# Patient Record
Sex: Male | Born: 1950 | Race: White | Hispanic: No | Marital: Married | Smoking: Never smoker
Health system: Southern US, Community
[De-identification: ages and names within clinical notes are randomized; demographics above are authoritative.]

## PROBLEM LIST (undated history)

## (undated) DIAGNOSIS — N200 Calculus of kidney: Secondary | ICD-10-CM

## (undated) HISTORY — PX: BACK SURGERY: SHX140

---

## 2016-12-20 ENCOUNTER — Emergency Department (HOSPITAL_BASED_OUTPATIENT_CLINIC_OR_DEPARTMENT_OTHER): Payer: Medicare HMO

## 2016-12-20 ENCOUNTER — Other Ambulatory Visit: Payer: Self-pay

## 2016-12-20 ENCOUNTER — Inpatient Hospital Stay (HOSPITAL_BASED_OUTPATIENT_CLINIC_OR_DEPARTMENT_OTHER)
Admission: EM | Admit: 2016-12-20 | Discharge: 2017-01-03 | DRG: 024 | Disposition: A | Discharge status: Home Health | Payer: Medicare HMO | Attending: Neurosurgery | Admitting: Neurosurgery

## 2016-12-20 ENCOUNTER — Encounter (HOSPITAL_BASED_OUTPATIENT_CLINIC_OR_DEPARTMENT_OTHER): Payer: Self-pay

## 2016-12-20 DIAGNOSIS — M533 Sacrococcygeal disorders, not elsewhere classified: Secondary | ICD-10-CM | POA: Diagnosis present

## 2016-12-20 DIAGNOSIS — G914 Hydrocephalus in diseases classified elsewhere: Secondary | ICD-10-CM | POA: Diagnosis present

## 2016-12-20 DIAGNOSIS — R61 Generalized hyperhidrosis: Secondary | ICD-10-CM | POA: Diagnosis present

## 2016-12-20 DIAGNOSIS — M5489 Other dorsalgia: Secondary | ICD-10-CM | POA: Diagnosis present

## 2016-12-20 DIAGNOSIS — R2681 Unsteadiness on feet: Secondary | ICD-10-CM | POA: Diagnosis present

## 2016-12-20 DIAGNOSIS — I609 Nontraumatic subarachnoid hemorrhage, unspecified: Secondary | ICD-10-CM | POA: Diagnosis present

## 2016-12-20 DIAGNOSIS — Z87442 Personal history of urinary calculi: Secondary | ICD-10-CM | POA: Diagnosis not present

## 2016-12-20 DIAGNOSIS — I608 Other nontraumatic subarachnoid hemorrhage: Principal | ICD-10-CM | POA: Diagnosis present

## 2016-12-20 DIAGNOSIS — M542 Cervicalgia: Secondary | ICD-10-CM | POA: Diagnosis present

## 2016-12-20 HISTORY — DX: Calculus of kidney: N20.0

## 2016-12-20 LAB — BASIC METABOLIC PANEL
ANION GAP: 10 (ref 5–15)
BUN: 20 mg/dL (ref 6–20)
CHLORIDE: 102 mmol/L (ref 101–111)
CO2: 25 mmol/L (ref 22–32)
Calcium: 8.9 mg/dL (ref 8.9–10.3)
Creatinine, Ser: 0.99 mg/dL (ref 0.61–1.24)
GFR calc Af Amer: >60 mL/min (ref >60)
GFR calc non Af Amer: >60 mL/min (ref >60)
Glucose, Bld: 110 mg/dL — ABNORMAL HIGH (ref 65–99)
POTASSIUM: 3.4 mmol/L — AB (ref 3.5–5.1)
Sodium: 137 mmol/L (ref 135–145)

## 2016-12-20 LAB — CBC WITH DIFFERENTIAL/PLATELET
BASOS ABS: 0 10*3/uL (ref 0.0–0.1)
BASOS PCT: 1 %
Eosinophils Absolute: 0.5 10*3/uL (ref 0.0–0.7)
Eosinophils Relative: 8 %
HEMATOCRIT: 42.3 % (ref 39.0–52.0)
HEMOGLOBIN: 14.6 g/dL (ref 13.0–17.0)
Lymphocytes Relative: 27 %
Lymphs Abs: 1.5 10*3/uL (ref 0.7–4.0)
MCH: 30.7 pg (ref 26.0–34.0)
MCHC: 34.5 g/dL (ref 30.0–36.0)
MCV: 88.9 fL (ref 78.0–100.0)
Monocytes Absolute: 0.6 10*3/uL (ref 0.1–1.0)
Monocytes Relative: 11 %
NEUTROS PCT: 53 %
Neutro Abs: 3.1 10*3/uL (ref 1.7–7.7)
Platelets: 196 10*3/uL (ref 150–400)
RBC: 4.76 MIL/uL (ref 4.22–5.81)
RDW: 12.8 % (ref 11.5–15.5)
WBC: 5.7 10*3/uL (ref 4.0–10.5)

## 2016-12-20 MED ORDER — PROMETHAZINE HCL 25 MG/ML IJ SOLN
12.5000 mg | Freq: Once | INTRAMUSCULAR | Status: AC
Start: 2016-12-20 — End: 2016-12-20
  Administered 2016-12-20: 12.5 mg via INTRAVENOUS
  Filled 2016-12-20: qty 1

## 2016-12-20 MED ORDER — ONDANSETRON HCL 4 MG/2ML IJ SOLN
INTRAMUSCULAR | Status: AC
  Filled 2016-12-20: qty 2

## 2016-12-20 MED ORDER — NIMODIPINE 60 MG/20ML PO SOLN
60.0000 mg | ORAL | Status: DC
  Filled 2016-12-20: qty 20

## 2016-12-20 MED ORDER — PANTOPRAZOLE SODIUM 40 MG PO TBEC
40.0000 mg | DELAYED_RELEASE_TABLET | Freq: Every day | ORAL | Status: DC
  Administered 2016-12-22 – 2017-01-03 (×13): 40 mg via ORAL
  Filled 2016-12-20 (×13): qty 1

## 2016-12-20 MED ORDER — MORPHINE SULFATE (PF) 4 MG/ML IV SOLN
INTRAVENOUS | Status: AC
  Administered 2016-12-20: 4 mg via INTRAVENOUS
  Filled 2016-12-20: qty 1

## 2016-12-20 MED ORDER — ONDANSETRON 4 MG PO TBDP: 4.0000 mg | ORAL_TABLET | Freq: Four times a day (QID) | ORAL | Status: DC | PRN

## 2016-12-20 MED ORDER — HYDROMORPHONE HCL 1 MG/ML IJ SOLN
0.5000 mg | INTRAMUSCULAR | Status: DC | PRN
  Administered 2016-12-22 – 2017-01-03 (×20): 0.5 mg via INTRAVENOUS
  Filled 2016-12-20 (×21): qty 0.5

## 2016-12-20 MED ORDER — NICARDIPINE HCL IN NACL 20-0.86 MG/200ML-% IV SOLN
0.0000 mg/h | INTRAVENOUS | Status: DC
  Administered 2016-12-21: 4 mg/h via INTRAVENOUS
  Administered 2016-12-21: 6 mg/h via INTRAVENOUS
  Administered 2016-12-21: 4 mg/h via INTRAVENOUS
  Administered 2016-12-21: 7 mg/h via INTRAVENOUS
  Administered 2016-12-22: 4 mg/h via INTRAVENOUS
  Administered 2016-12-23: 2.5 mg/h via INTRAVENOUS
  Filled 2016-12-20 (×5): qty 200

## 2016-12-20 MED ORDER — ACETAMINOPHEN 325 MG PO TABS
650.0000 mg | ORAL_TABLET | ORAL | Status: DC | PRN
  Administered 2016-12-22 – 2017-01-01 (×3): 650 mg via ORAL
  Filled 2016-12-20 (×4): qty 2

## 2016-12-20 MED ORDER — FENTANYL CITRATE (PF) 100 MCG/2ML IJ SOLN
100.0000 ug | INTRAMUSCULAR | Status: DC | PRN
  Administered 2016-12-20: 100 ug via INTRAVENOUS
  Administered 2016-12-21: 50 ug via INTRAVENOUS
  Administered 2017-01-01 – 2017-01-02 (×5): 100 ug via INTRAVENOUS
  Filled 2016-12-20 (×7): qty 2

## 2016-12-20 MED ORDER — DOCUSATE SODIUM 100 MG PO CAPS
100.0000 mg | ORAL_CAPSULE | Freq: Two times a day (BID) | ORAL | Status: DC
  Administered 2016-12-22 – 2017-01-02 (×22): 100 mg via ORAL
  Filled 2016-12-20 (×22): qty 1

## 2016-12-20 MED ORDER — ONDANSETRON HCL 4 MG/2ML IJ SOLN
4.0000 mg | Freq: Once | INTRAMUSCULAR | Status: AC
  Administered 2016-12-20: 4 mg via INTRAVENOUS

## 2016-12-20 MED ORDER — PANTOPRAZOLE SODIUM 40 MG PO PACK
40.0000 mg | PACK | Freq: Every day | ORAL | Status: DC
  Filled 2016-12-20: qty 20

## 2016-12-20 MED ORDER — ONDANSETRON HCL 4 MG/2ML IJ SOLN
4.0000 mg | Freq: Four times a day (QID) | INTRAMUSCULAR | Status: DC | PRN
  Administered 2016-12-23 – 2016-12-25 (×3): 4 mg via INTRAVENOUS
  Filled 2016-12-20 (×3): qty 2

## 2016-12-20 MED ORDER — ACETAMINOPHEN 160 MG/5ML PO SOLN
650.0000 mg | ORAL | Status: DC | PRN
  Filled 2016-12-20: qty 20.3

## 2016-12-20 MED ORDER — INDAPAMIDE 1.25 MG PO TABS
1.2500 mg | ORAL_TABLET | Freq: Every day | ORAL | Status: DC
  Administered 2016-12-23 – 2017-01-02 (×11): 1.25 mg via ORAL
  Filled 2016-12-20 (×14): qty 1

## 2016-12-20 MED ORDER — MORPHINE SULFATE (PF) 4 MG/ML IV SOLN
4.0000 mg | INTRAVENOUS | Status: DC | PRN
  Administered 2016-12-20: 4 mg via INTRAVENOUS

## 2016-12-20 MED ORDER — NIMODIPINE 30 MG PO CAPS
60.0000 mg | ORAL_CAPSULE | ORAL | Status: DC
  Administered 2016-12-22 – 2017-01-03 (×74): 60 mg via ORAL
  Filled 2016-12-20 (×68): qty 2

## 2016-12-20 MED ORDER — IOPAMIDOL (ISOVUE-370) INJECTION 76%
100.0000 mL | Freq: Once | INTRAVENOUS | Status: AC | PRN
  Administered 2016-12-20: 100 mL via INTRAVENOUS

## 2016-12-20 MED ORDER — STROKE: EARLY STAGES OF RECOVERY BOOK
Freq: Once | Status: AC
  Administered 2016-12-22: 06:00:00
  Filled 2016-12-20 (×2): qty 1

## 2016-12-20 MED ORDER — SODIUM CHLORIDE 0.9 % IV SOLN
500.0000 mg | Freq: Two times a day (BID) | INTRAVENOUS | Status: DC
  Administered 2016-12-21 – 2016-12-23 (×6): 500 mg via INTRAVENOUS
  Filled 2016-12-20 (×7): qty 5

## 2016-12-20 MED ORDER — TRAMADOL HCL 50 MG PO TABS
50.0000 mg | ORAL_TABLET | Freq: Four times a day (QID) | ORAL | Status: DC | PRN
  Administered 2016-12-22: 100 mg via ORAL
  Administered 2016-12-23: 50 mg via ORAL
  Administered 2016-12-23 – 2017-01-03 (×21): 100 mg via ORAL
  Filled 2016-12-20 (×21): qty 2
  Filled 2016-12-20: qty 1
  Filled 2016-12-20: qty 2

## 2016-12-20 MED ORDER — ACETAMINOPHEN 650 MG RE SUPP
650.0000 mg | RECTAL | Status: DC | PRN
  Administered 2016-12-21: 650 mg via RECTAL
  Filled 2016-12-20: qty 1

## 2016-12-20 MED ORDER — NICARDIPINE HCL IN NACL 20-0.86 MG/200ML-% IV SOLN
3.0000 mg/h | Freq: Once | INTRAVENOUS | Status: AC
  Administered 2016-12-20: 5 mg/h via INTRAVENOUS
  Filled 2016-12-20: qty 200

## 2016-12-20 MED ORDER — SODIUM CHLORIDE 0.9 % IV SOLN
INTRAVENOUS | Status: DC
  Administered 2016-12-21 – 2016-12-24 (×5): via INTRAVENOUS
  Administered 2016-12-24: 1000 mL via INTRAVENOUS
  Administered 2016-12-25 (×2): via INTRAVENOUS

## 2016-12-20 MED ORDER — LABETALOL HCL 5 MG/ML IV SOLN
20.0000 mg | Freq: Once | INTRAVENOUS | Status: DC
  Filled 2016-12-20: qty 4

## 2016-12-20 NOTE — ED Notes (Signed)
Patient sound asleep.  Family at bedside.

## 2016-12-20 NOTE — ED Notes (Signed)
MD made aware that patient vomited x 1.  Currently asleep after Fentanyl and Promethazine was given.  Cardene ongoing at 5 mg/hr.

## 2016-12-20 NOTE — ED Notes (Signed)
Transfer consent is signed by patient's wife.  Patient is very drowsy from Fentanyl and Phenergan.

## 2016-12-20 NOTE — ED Triage Notes (Signed)
Pt c/o onset of sudden HA, pale, sweats x 10 min PTA-NAD-presents to triage in w/c

## 2016-12-20 NOTE — H&P (Signed)
Chief Complaint   Chief Complaint  Patient presents with  . Headache    HPI   HPI: Trevor Perry is a 66 y.o. male who presented to [facility] [phone] due to acute onset headache and diaphoresis. CT scan showed SAH but CTA negative for aneurysm. Transferred to [facility] for admission in Neuro ICU.  Patient is currently sedated. Unable to obtain history from him. Daughter and wife present at bedside giving history. Acute onset occipital headache. No associated trauma. Associated with diaphoresis and pale in color. Lasted 10 minutes and symptoms improved slightly. While in ED vomited x1. Was healthy leading up to today. No significant medical problems, not on medications including anticoagulation.    Patient Active Problem List   Diagnosis Date Noted  . Subarachnoid hemorrhage ([place]) 12/20/2016    PMH: Past Medical History:  Diagnosis Date  . Kidney stone     PSH: Past Surgical History:  Procedure Laterality Date  . BACK SURGERY      Medications Prior to Admission  Medication Sig Dispense Refill Last Dose  . INDAPAMIDE PO Take by mouth.       SH: Social History   Tobacco Use  . Smoking status: Never Smoker  . Smokeless tobacco: Never Used  Substance Use Topics  . Alcohol use: No    Frequency: Never  . Drug use: No    MEDS: Prior to Admission medications   Medication Sig Start Date End Date Taking? Authorizing Provider  INDAPAMIDE PO Take by mouth.   Yes [provider]    ALLERGY: No Known Allergies  Social History   Tobacco Use  . Smoking status: Never Smoker  . Smokeless tobacco: Never Used  Substance Use Topics  . Alcohol use: No    Frequency: Never     No family history on file.   ROS   ROS unable to obtain secondary to sedation  Exam   Vitals:   12/21/16 0300 12/21/16 0400  BP: (!) 146/75   Pulse: (!) 107   Resp: 16   Temp:  97.9 F (36.6 C)  SpO2: 91%    WDWN Sedated. Snoring. PERRL Will arouse to painful stimuli but  not sustain Does move all extremities  Results - Imaging/Labs   Results for orders placed or performed during the hospital encounter of 12/20/16 (from the past 48 hour(s))  CBC with Differential/Platelet     Status: None   Collection Time: 12/20/16  8:30 PM  Result Value Ref Range   WBC 5.7 4.0 - 10.5 K/uL   RBC 4.76 4.22 - 5.81 MIL/uL   Hemoglobin 14.6 13.0 - 17.0 g/dL   HCT 42.3 39.0 - 52.0 %   MCV 88.9 78.0 - 100.0 fL   MCH 30.7 26.0 - 34.0 pg   MCHC 34.5 30.0 - 36.0 g/dL   RDW 12.8 11.5 - 15.5 %   Platelets 196 150 - 400 K/uL   Neutrophils Relative % 53 %   Neutro Abs 3.1 1.7 - 7.7 K/uL   Lymphocytes Relative 27 %   Lymphs Abs 1.5 0.7 - 4.0 K/uL   Monocytes Relative 11 %   Monocytes Absolute 0.6 0.1 - 1.0 K/uL   Eosinophils Relative 8 %   Eosinophils Absolute 0.5 0.0 - 0.7 K/uL   Basophils Relative 1 %   Basophils Absolute 0.0 0.0 - 0.1 K/uL  Basic metabolic panel     Status: Abnormal   Collection Time: 12/20/16  8:30 PM  Result Value Ref Range   Sodium  137 135 - 145 mmol/L   Potassium 3.4 (L) 3.5 - 5.1 mmol/L   Chloride 102 101 - 111 mmol/L   CO2 25 22 - 32 mmol/L   Glucose, Bld 110 (H) 65 - 99 mg/dL   BUN 20 6 - 20 mg/dL   Creatinine, Ser 0.99 0.61 - 1.24 mg/dL   Calcium 8.9 8.9 - 10.3 mg/dL   GFR calc non Af Amer >60 >60 mL/min   GFR calc Af Amer >60 >60 mL/min    Comment: (NOTE) The eGFR has been calculated using the CKD EPI equation. This calculation has not been validated in all clinical situations. eGFR's persistently <60 mL/min signify possible Chronic Kidney Disease.    Anion gap 10 5 - 15  MRSA PCR Screening     Status: None   Collection Time: 12/20/16 11:53 PM  Result Value Ref Range   MRSA by PCR NEGATIVE NEGATIVE    Comment:        The GeneXpert MRSA Assay (FDA approved for NASAL specimens only), is one component of a comprehensive MRSA colonization surveillance program. It is not intended to diagnose MRSA infection nor to guide or monitor  treatment for MRSA infections.     Ct Angio Head W Or Wo Contrast  Result Date: 12/20/2016 CLINICAL DATA:  Acute headache EXAM: CT ANGIOGRAPHY HEAD TECHNIQUE: Multidetector CT imaging of the head was performed using the standard protocol during bolus administration of intravenous contrast. Multiplanar CT image reconstructions and MIPs were obtained to evaluate the vascular anatomy. CONTRAST:  186m ISOVUE-370 IOPAMIDOL (ISOVUE-370) INJECTION 76% COMPARISON:  None. FINDINGS: CT HEAD Brain: There is subarachnoid hemorrhage within the basal cisterns and extending into the sylvian fissures. There is no midline shift or mass effect. No hydrocephalus. Otherwise, the appearance of the brain is normal for age. Vascular: No hyperdense vessel or unexpected calcification. Skull: Normal visualized skull base, calvarium and extracranial soft tissues. Sinuses/Orbits: No sinus fluid levels or advanced mucosal thickening. No mastoid effusion. Normal orbits. CTA HEAD Anterior circulation: --Intracranial internal carotid arteries: Normal. --Anterior cerebral arteries: Normal. --Middle cerebral arteries: Normal. --Posterior communicating arteries: Absent bilaterally. Posterior circulation: --Posterior cerebral arteries: Normal. --Superior cerebellar arteries: Normal. --Basilar artery: Normal. --Anterior inferior cerebellar arteries: Not clearly visualized, which is not uncommon. --Posterior inferior cerebellar arteries: Normal. Vertebral arteries are left dominant and the visualized portions are normal. Venous sinuses: Poor opacification due to contrast timing. Anatomic variants: None Delayed phase: Not performed. IMPRESSION: 1. Perimesencephalic and basal cistern pattern subarachnoid hemorrhage. No basilar tip aneurysm or other source of hemorrhage is identified. 2. No mass effect or focal parenchymal abnormality. 3. No emergent large vessel occlusion. Critical Value/emergent results were called by telephone at the time of  interpretation on 12/20/2016 at 10:00 pm to Dr. MTanna Furry who verbally acknowledged these results. Electronically Signed   By: KUlyses JarredM.D.   On: 12/20/2016 22:11   Ct Head Wo Contrast  Result Date: 12/21/2016 CLINICAL DATA:  Excessive somnolence.  Subarachnoid hemorrhage. EXAM: CT HEAD WITHOUT CONTRAST TECHNIQUE: Contiguous axial images were obtained from the base of the skull through the vertex without intravenous contrast. COMPARISON:  Head CT 12/20/2016 FINDINGS: Brain: Unchanged perimesencephalic basal cisterns subarachnoid hematoma. There is developing hydrocephalus, with the third ventricle now measuring 11 mm, previously 8 mm. The atria of the lateral ventricles and the temporal horns have also markedly increased in size. There is now a small amount of intraventricular blood within the occipital horns of both lateral ventricles and within the cerebral aqueduct  and fourth ventricle. Vascular: Mild intravascular enhancement secondary to recent contrast administration. Skull: Normal visualized skull base, calvarium and extracranial soft tissues. Sinuses/Orbits: No sinus fluid levels or advanced mucosal thickening. No mastoid effusion. Normal orbits. IMPRESSION: 1. Unchanged volume very the cephalic and basal cisterns subarachnoid hemorrhage. 2. There is now a small amount of intraventricular blood in the occipital horns, cerebral aqueduct and fourth ventricle. 3. 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 Electronically Signed   By: Ulyses Jarred M.D.   On: 12/21/2016 02:46   Ct Angio Neck W Or Wo Contrast  Result Date: 12/21/2016 CLINICAL DATA:  Subarachnoid hemorrhage EXAM: CT ANGIOGRAPHY NECK TECHNIQUE: Multidetector CT imaging of the neck was performed using the standard protocol during bolus administration of intravenous contrast. Multiplanar CT image reconstructions and MIPs were obtained to evaluate the vascular anatomy. Carotid  stenosis measurements (when applicable) are obtained utilizing NASCET criteria, using the distal internal carotid diameter as the denominator. CONTRAST:  62m ISOVUE-370 IOPAMIDOL (ISOVUE-370) INJECTION 76% COMPARISON:  Head CT 12/21/2016 CTA head 12/20/2016 FINDINGS: Aortic arch: There is mild calcific atherosclerosis of the aortic arch. There is no aneurysm, dissection or hemodynamically significant stenosis of the visualized ascending aorta and aortic arch. Normal variant aortic arch branching pattern with the brachiocephalic and left common carotid arteries sharing a common origin. The visualized proximal subclavian arteries are normal. Right carotid system: The right common carotid origin is widely patent. There is no common carotid or internal carotid artery dissection or aneurysm. Atherosclerotic calcification at the carotid bifurcation without hemodynamically significant stenosis. Left carotid system: The left common carotid origin is widely patent. There is no common carotid or internal carotid artery dissection or aneurysm. Atherosclerotic calcification at the carotid bifurcation without hemodynamically significant stenosis. Vertebral arteries: The vertebral system is left dominant. Both vertebral artery origins are patent. Both vertebral arteries are normal to their confluence with the basilar artery. Skeleton: There is no bony spinal canal stenosis. No lytic or blastic lesions. Other neck: The nasopharynx is clear. The oropharynx and hypopharynx are normal. The epiglottis is normal. The supraglottic larynx, glottis and subglottic larynx are normal. No retropharyngeal collection. The parapharyngeal spaces are preserved. The parotid and submandibular glands are normal. No sialolithiasis or salivary ductal dilatation. The thyroid gland is normal. There is no cervical lymphadenopathy. Upper chest: No pneumothorax or pleural effusion. No nodules or masses. Review of the MIP images confirms the above findings  IMPRESSION: No occlusion or high-grade stenosis of the carotid or vertebral arteries. Electronically Signed   By: KUlyses JarredM.D.   On: 12/21/2016 02:51    Impression/Plan   66y.o. male with non-traumatic SAH. CTA head/neck negative for aneurysm.  Neuro exam is difficult to obtain likely secondary to sedation. No acute NS intervention. Admit to ICU, SAH protocol. Will allow sedation to wear off so a thorough exam can be performed. SBP <150.

## 2016-12-20 NOTE — ED Provider Notes (Signed)
[facility] EMERGENCY DEPARTMENT Provider Note   CSN: [id number] Arrival date & time: 12/20/16  1934     History   Chief Complaint Chief Complaint  Patient presents with  . Headache    HPI Trevor Perry is a 66 y.o. male. Chief complaint sudden headache  HPI 66 year old male with no significant past medical history. Was eating dinner with his family tonight. A sudden severe occipital headache. Seemed to start with neck pain and radiate to his occiput. No loss of consciousness. He became diaphoretic. Family noticed a change in his demeanor. Was given some Tylenol by patron next to him. Headache was initially 8 out of 10. He states his kidney stone pain is a 10 out of 10. No past similar episodes. Is down to a 4. No nausea. No vision changes. No numbness weakness or tingling of extremities. Feels some discomfort in his neck  Past Medical History:  Diagnosis Date  . Kidney stone     Patient Active Problem List   Diagnosis Date Noted  . Subarachnoid hemorrhage ([facility]) 12/20/2016    Past Surgical History:  Procedure Laterality Date  . BACK SURGERY         Home Medications    Prior to Admission medications   Medication Sig Start Date End Date Taking? Authorizing Provider  INDAPAMIDE PO Take by mouth.   Yes [provider]    Family History No family history on file.  Social History Social History   Tobacco Use  . Smoking status: Never Smoker  . Smokeless tobacco: Never Used  Substance Use Topics  . Alcohol use: No    Frequency: Never  . Drug use: No     Allergies   Patient has no known allergies.   Review of Systems Review of Systems  Constitutional: Negative for appetite change, chills, diaphoresis, fatigue and fever.  HENT: Negative for mouth sores, sore throat and trouble swallowing.   Eyes: Negative for visual disturbance.  Respiratory: Negative for cough, chest tightness, shortness of breath and wheezing.   Cardiovascular: Negative  for chest pain.  Gastrointestinal: Negative for abdominal distention, abdominal pain, diarrhea, nausea and vomiting.  Endocrine: Negative for polydipsia, polyphagia and polyuria.  Genitourinary: Negative for dysuria, frequency and hematuria.  Musculoskeletal: Positive for neck pain. Negative for gait problem.  Skin: Negative for color change, pallor and rash.  Neurological: Positive for headaches. Negative for dizziness, syncope and light-headedness.  Hematological: Does not bruise/bleed easily.  Psychiatric/Behavioral: Negative for behavioral problems and confusion.     Physical Exam Updated Vital Signs BP (!) 146/73   Pulse 95   Temp 98.1 F (36.7 C) (Oral)   Resp 18   Ht 6' (1.829 m)   Wt 102.1 kg (225 lb)   SpO2 (!) 88%   BMI 30.52 kg/m   Physical Exam  Constitutional: He is oriented to person, place, and time. He appears well-developed and well-nourished. No distress.  HENT:  Head: Normocephalic.  Eyes: Conjunctivae are normal. Pupils are equal, round, and reactive to light. No scleral icterus.  Neck: Normal range of motion. Neck supple. No thyromegaly present.  Is able to flex his chin to his neck. Describes a discomfort in the back of his neck with this. Not frankly rigid.  Cardiovascular: Normal rate and regular rhythm. Exam reveals no gallop and no friction rub.  No murmur heard. Pulmonary/Chest: Effort normal and breath sounds normal. No respiratory distress. He has no wheezes. He has no rales.  Abdominal: Soft. Bowel sounds are normal.  He exhibits no distension. There is no tenderness. There is no rebound.  Musculoskeletal: Normal range of motion.  Neurological: He is alert and oriented to person, place, and time.  Awake and alert. Oriented. Normal cranial nerves. Normal peripheral neurological examination.  Skin: Skin is warm and dry. No rash noted.  Psychiatric: He has a normal mood and affect. His behavior is normal.     ED Treatments / Results  Labs (all  labs ordered are listed, but only abnormal results are displayed) Labs Reviewed  BASIC METABOLIC PANEL - Abnormal; Notable for the following components:      Result Value   Potassium 3.4 (*)    Glucose, Bld 110 (*)    All other components within normal limits  CBC WITH DIFFERENTIAL/PLATELET  HIV ANTIBODY (ROUTINE TESTING)  CBC  PROTIME-INR  APTT  RAPID URINE DRUG SCREEN, HOSP PERFORMED    EKG  EKG Interpretation None       Radiology Ct Angio Head W Or Wo Contrast  Result Date: 12/20/2016 CLINICAL DATA:  Acute headache EXAM: CT ANGIOGRAPHY HEAD TECHNIQUE: Multidetector CT imaging of the head was performed using the standard protocol during bolus administration of intravenous contrast. Multiplanar CT image reconstructions and MIPs were obtained to evaluate the vascular anatomy. CONTRAST:  100mL ISOVUE-370 IOPAMIDOL (ISOVUE-370) INJECTION 76% COMPARISON:  None. FINDINGS: CT HEAD Brain: There is subarachnoid hemorrhage within the basal cisterns and extending into the sylvian fissures. There is no midline shift or mass effect. No hydrocephalus. Otherwise, the appearance of the brain is normal for age. Vascular: No hyperdense vessel or unexpected calcification. Skull: Normal visualized skull base, calvarium and extracranial soft tissues. Sinuses/Orbits: No sinus fluid levels or advanced mucosal thickening. No mastoid effusion. Normal orbits. CTA HEAD Anterior circulation: --Intracranial internal carotid arteries: Normal. --Anterior cerebral arteries: Normal. --Middle cerebral arteries: Normal. --Posterior communicating arteries: Absent bilaterally. Posterior circulation: --Posterior cerebral arteries: Normal. --Superior cerebellar arteries: Normal. --Basilar artery: Normal. --Anterior inferior cerebellar arteries: Not clearly visualized, which is not uncommon. --Posterior inferior cerebellar arteries: Normal. Vertebral arteries are left dominant and the visualized portions are normal. Venous  sinuses: Poor opacification due to contrast timing. Anatomic variants: None Delayed phase: Not performed. IMPRESSION: 1. Perimesencephalic and basal cistern pattern subarachnoid hemorrhage. No basilar tip aneurysm or other source of hemorrhage is identified. 2. No mass effect or focal parenchymal abnormality. 3. No emergent large vessel occlusion. Critical Value/emergent results were called by telephone at the time of interpretation on 12/20/2016 at 10:00 pm to Dr. Rolland PorterMARK Blimi Godby, who verbally acknowledged these results. Electronically Signed   By: Deatra RobinsonKevin  Herman M.D.   On: 12/20/2016 22:11    Procedures Procedures (including critical care time)  Medications Ordered in ED Medications  morphine 4 MG/ML injection 4 mg (4 mg Intravenous Given 12/20/16 2057)  fentaNYL (SUBLIMAZE) injection 100 mcg (100 mcg Intravenous Given 12/20/16 2216)  indapamide (LOZOL) tablet 1.25 mg (not administered)   stroke: mapping our early stages of recovery book (not administered)  0.9 %  sodium chloride infusion (not administered)  acetaminophen (TYLENOL) tablet 650 mg (not administered)    Or  acetaminophen (TYLENOL) solution 650 mg (not administered)    Or  acetaminophen (TYLENOL) suppository 650 mg (not administered)  docusate sodium (COLACE) capsule 100 mg (not administered)  ondansetron (ZOFRAN-ODT) disintegrating tablet 4 mg (not administered)    Or  ondansetron (ZOFRAN) injection 4 mg (not administered)  niMODipine (NIMOTOP) capsule 60 mg (not administered)    Or  NiMODipine (NYMALIZE) 60 MG/20ML oral solution 60  mg (not administered)  pantoprazole (PROTONIX) EC tablet 40 mg (not administered)    Or  pantoprazole sodium (PROTONIX) 40 mg/20 mL oral suspension 40 mg (not administered)  traMADol (ULTRAM) tablet 50-100 mg (not administered)  HYDROmorphone (DILAUDID) injection 0.5 mg (not administered)  labetalol (NORMODYNE,TRANDATE) injection 20 mg (not administered)    And  nicardipine (CARDENE) 20mg  in  0.86% saline 200ml IV infusion (0.1 mg/ml) (not administered)  levETIRAcetam (KEPPRA) 500 mg in sodium chloride 0.9 % 100 mL IVPB (not administered)  ondansetron (ZOFRAN) injection 4 mg (4 mg Intravenous Given 12/20/16 2055)  iopamidol (ISOVUE-370) 76 % injection 100 mL (100 mLs Intravenous Contrast Given 12/20/16 2153)  nicardipine (CARDENE) 20mg  in 0.86% saline 200ml IV infusion (0.1 mg/ml) (5 mg/hr Intravenous Transfusing/Transfer 12/20/16 2252)  promethazine (PHENERGAN) injection 12.5 mg (12.5 mg Intravenous Given 12/20/16 2216)     Initial Impression / Assessment and Plan / ED Course  I have reviewed the triage vital signs and the nursing notes.  Pertinent labs & imaging results that were available during my care of the patient were reviewed by me and considered in my medical decision making (see chart for details).  Clinical Course as of Dec 20 2324  Thu Dec 20, 2016  2136 CT Head Wo Contrast [MJ]  2136 CT Head Wo Contrast [MJ]  2136 CT Head Wo Contrast [MJ]  2136 CT Head Wo Contrast [MJ]    Clinical Course User Index [MJ] Rolland PorterJames, Leilany Digeronimo, MD   Initially not hypertensive. No nausea. Improving headache. CT shows diffuse basilar subarachnoid hemorrhage without ventricular bleed. Symmetric ventricles. CT image or does not show AVM, or aneurysm. Constellation of symptoms and findings consistent with Paramenencephalic subarachnoid hemorrhage.  Patient became hypertensive with increasing pain and emesis. Given morphine and Zofran. Minimal improvement. Given fentanyl and Phenergan. Improvement for some sedation. Did not require airway management. Continuous respond verbally.  Cardene drip initiated. Discussed with neurosurgery. They [facility] Dr.Nundkamar.  CRITICAL CARE Performed by: Claudean KindsMark Joseph Norah Fick   Total critical care time: 30 minutes  Critical care time was exclusive of separately billable procedures and treating other  patients.  Critical care was necessary to treat or prevent imminent or life-threatening deterioration.  Critical care was time spent personally by me on the following activities: development of treatment plan with patient and/or surrogate as well as nursing, discussions with consultants, evaluation of patient's response to treatment, examination of patient, obtaining history from patient or surrogate, ordering and performing treatments and interventions, ordering and review of laboratory studies, ordering and review of radiographic studies, pulse oximetry and re-evaluation of patient's condition.   Final Clinical Impressions(s) / ED Diagnoses   Final diagnoses:  SAH (subarachnoid hemorrhage) [facility](HCC)    ED Discharge Orders    None       Rolland PorterJames, Joshaua Epple, MD 12/20/16 2330

## 2016-12-20 NOTE — ED Notes (Signed)
Patient transported to CT 

## 2016-12-21 ENCOUNTER — Encounter (HOSPITAL_COMMUNITY): Payer: Self-pay

## 2016-12-21 ENCOUNTER — Inpatient Hospital Stay (HOSPITAL_COMMUNITY): Payer: Medicare HMO | Admitting: Anesthesiology

## 2016-12-21 ENCOUNTER — Inpatient Hospital Stay (HOSPITAL_COMMUNITY): Payer: Medicare HMO

## 2016-12-21 ENCOUNTER — Encounter (HOSPITAL_COMMUNITY)
Admission: EM | Disposition: A | Discharge status: Home Health | Payer: Self-pay | Source: Home / Self Care | Attending: Neurosurgery

## 2016-12-21 DIAGNOSIS — I609 Nontraumatic subarachnoid hemorrhage, unspecified: Secondary | ICD-10-CM

## 2016-12-21 HISTORY — PX: RADIOLOGY WITH ANESTHESIA: SHX6223

## 2016-12-21 HISTORY — PX: IR ANGIO INTRA EXTRACRAN SEL INTERNAL CAROTID BILAT MOD SED: IMG5363

## 2016-12-21 HISTORY — PX: IR ANGIO VERTEBRAL SEL VERTEBRAL BILAT MOD SED: IMG5369

## 2016-12-21 LAB — RAPID URINE DRUG SCREEN, HOSP PERFORMED
AMPHETAMINES: NOT DETECTED
Barbiturates: NOT DETECTED
Benzodiazepines: POSITIVE — AB
Cocaine: NOT DETECTED
Opiates: POSITIVE — AB
Tetrahydrocannabinol: NOT DETECTED

## 2016-12-21 LAB — MRSA PCR SCREENING: MRSA BY PCR: NEGATIVE

## 2016-12-21 LAB — GLUCOSE, CAPILLARY: Glucose-Capillary: 114 mg/dL — ABNORMAL HIGH (ref 65–99)

## 2016-12-21 SURGERY — RADIOLOGY WITH ANESTHESIA
Anesthesia: Monitor Anesthesia Care

## 2016-12-21 MED ORDER — FENTANYL CITRATE (PF) 100 MCG/2ML IJ SOLN
INTRAMUSCULAR | Status: AC
  Filled 2016-12-21: qty 2

## 2016-12-21 MED ORDER — IOPAMIDOL (ISOVUE-300) INJECTION 61%
INTRAVENOUS | Status: AC
  Filled 2016-12-21: qty 100

## 2016-12-21 MED ORDER — LIDOCAINE HCL (PF) 1 % IJ SOLN
INTRAMUSCULAR | Status: AC | PRN
  Administered 2016-12-21: 8 mL

## 2016-12-21 MED ORDER — FENTANYL CITRATE (PF) 250 MCG/5ML IJ SOLN
INTRAMUSCULAR | Status: DC | PRN
  Administered 2016-12-21: 25 ug via INTRAVENOUS

## 2016-12-21 MED ORDER — SODIUM CHLORIDE 0.9 % IV SOLN: INTRAVENOUS | Status: DC

## 2016-12-21 MED ORDER — NICARDIPINE HCL IN NACL 20-0.86 MG/200ML-% IV SOLN
INTRAVENOUS | Status: AC
  Filled 2016-12-21: qty 200

## 2016-12-21 MED ORDER — ORAL CARE MOUTH RINSE
15.0000 mL | Freq: Two times a day (BID) | OROMUCOSAL | Status: DC
  Administered 2016-12-21 – 2016-12-22 (×3): 15 mL via OROMUCOSAL

## 2016-12-21 MED ORDER — IOPAMIDOL (ISOVUE-300) INJECTION 61%: 50.0000 mL | Freq: Once | INTRAVENOUS | Status: DC | PRN

## 2016-12-21 MED ORDER — SODIUM CHLORIDE 0.9 % IV SOLN
INTRAVENOUS | Status: DC | PRN
  Administered 2016-12-21: 19:00:00 via INTRAVENOUS

## 2016-12-21 MED ORDER — LABETALOL HCL 5 MG/ML IV SOLN
10.0000 mg | INTRAVENOUS | Status: DC | PRN
  Administered 2016-12-21 – 2017-01-02 (×3): 20 mg via INTRAVENOUS
  Administered 2017-01-03: 10 mg via INTRAVENOUS
  Administered 2017-01-03: 20 mg via INTRAVENOUS
  Filled 2016-12-21 (×5): qty 4

## 2016-12-21 MED ORDER — FENTANYL CITRATE (PF) 100 MCG/2ML IJ SOLN: 25.0000 ug | INTRAMUSCULAR | Status: DC | PRN

## 2016-12-21 MED ORDER — IOPAMIDOL (ISOVUE-370) INJECTION 76%
INTRAVENOUS | Status: AC
  Administered 2016-12-21: 50 mL
  Filled 2016-12-21: qty 50

## 2016-12-21 MED ORDER — IOPAMIDOL (ISOVUE-300) INJECTION 61%
INTRAVENOUS | Status: AC
  Filled 2016-12-21: qty 150

## 2016-12-21 MED ORDER — LIDOCAINE HCL 1 % IJ SOLN
INTRAMUSCULAR | Status: AC
  Filled 2016-12-21: qty 20

## 2016-12-21 MED ORDER — LORAZEPAM 2 MG/ML IJ SOLN
1.0000 mg | Freq: Once | INTRAMUSCULAR | Status: AC
  Administered 2016-12-21: 2 mg via INTRAVENOUS
  Filled 2016-12-21: qty 1

## 2016-12-21 MED ORDER — MIDAZOLAM HCL 2 MG/2ML IJ SOLN
INTRAMUSCULAR | Status: AC
  Filled 2016-12-21: qty 2

## 2016-12-21 NOTE — Progress Notes (Signed)
Pt wanting to stand up to urinate. His PVR is >400. He has ventriculostomy and will be going to IR for arteriogram later this afternoon. Foley placed per order.

## 2016-12-21 NOTE — Progress Notes (Signed)
PT Cancellation Note  Patient Details Name: Trevor Perry MRN: [id number] DOB: 07-01-1950   Cancelled Treatment:    Reason Eval/Treat Not Completed: Medical issues which prohibited therapy(pt on bedrest)   [name] B [name] 12/21/2016, 7:54 AM  Delaney MeigsMaija Tabor [name], PT [phone]

## 2016-12-21 NOTE — Anesthesia Procedure Notes (Signed)
Procedure Name: MAC Date/Time: 12/21/2016 7:01 PM Performed by: Teressa Lower., CRNA Pre-anesthesia Checklist: Patient identified, Emergency Drugs available, Suction available, Patient being monitored and Timeout performed Patient Re-evaluated:Patient Re-evaluated prior to induction Oxygen Delivery Method: Nasal cannula

## 2016-12-21 NOTE — Progress Notes (Signed)
Late entry  Patient reportedly more lethargic so repeat CT was performed. CT head shows unchanged SAH with small amount present in ventricles and ? developing hydrocephalus. Examined patient. He is extremely drowsy, but was given ativan for CT sedation. Attending notified.  Will allow sedation to wear off a little more so a more thorough exam can be performed.  Will hold on any acute intervention presently.

## 2016-12-21 NOTE — Progress Notes (Signed)
OT Cancellation Note  Patient Details Name: Trevor Perry MRN: [id number] DOB: 14-Feb-1950   Cancelled Treatment:    Reason Eval/Treat Not Completed: Patient not medically ready. Pt currently on bedrest will await increased activity orders before proceeding with eval.  Evette GeorgesLeonard, Trevor Perry [phone] 12/21/2016, 7:13 AM

## 2016-12-21 NOTE — Progress Notes (Signed)
Patient is NPO and has not passed a swallow evaluation. Patient has oral nimodipine scheduled. Patient does not have an NG tube. This RN called MD to see if NG tube should be placed and medication given. MD declined at this time, no new orders given.

## 2016-12-21 NOTE — Progress Notes (Signed)
Pt and family taken down to short stay. Report given to RN and CRNA.

## 2016-12-21 NOTE — Progress Notes (Signed)
SLP Cancellation Note  Patient Details Name: Trevor Perry MRN: [id number] DOB: 11/12/1950   Cancelled treatment:       Reason Eval/Treat Not Completed: Medical issues which prohibited therapy. RN reports pt to stay NPO for procedure. Will follow for readiness.    Keiarah Orlowski, Riley NearingBonnie Caroline 12/21/2016, 10:10 AM

## 2016-12-21 NOTE — Procedures (Signed)
PROCEDURE: Right frontal ventriculostomy   SURGEON: Dr. Lisbeth RenshawNeelesh Jeliyah Middlebrooks, MD  ANESTHESIA: IV Sedation (fentanyl) with Local  EBL: Minimal  SPECIMENS: None  COMPLICATIONS: None  CONDITION: Hemodynamically stable  INDICATIONS: Mrs. Trevor ApplebaumRoy Aaronson is a 66 y.o. male admitted with subarachnoid hemorrhage and progressive lethargy with serial CT demonstrating progressive ventriculomegaly indicating hydrocephalus. EVD was therefore indicated. Risks and benefits of the procedure were reviewed with the patient's wife. All questions were answered and consent was obtained.  PROCEDURE IN DETAIL: After consent was obtained from the patient's wife, skin of the right frontal scalp was clipped, prepped and draped in the usual sterile fashion.  Scalp was then infiltrated with local anesthetic with epinephrine.  Skin incision was made sharply, and twist drill burr hole was made.  The dura was then incised, and the ventricular catheter was passed in one attempt into the right lateral ventricle.  Good CSF flow was obtained.  The catheter was then tunneled subcutaneously and connected to a drainage system and the skin incision closed.  The drain was then secured in place.  FINDINGS: 1. Opening pressure >20cm H2O 2. Slightly blood tinged CSF

## 2016-12-21 NOTE — Progress Notes (Signed)
Patient still somnolent. Aroused but not sustained NCN to proceed with EVD placement

## 2016-12-21 NOTE — Anesthesia Preprocedure Evaluation (Signed)
Anesthesia Evaluation  Patient identified by MRN, date of birth, ID band Patient awake    Reviewed: Allergy & Precautions, H&P , NPO status , Patient's Chart, lab work & pertinent test results  Airway Mallampati: II  TM Distance: >3 FB Neck ROM: Full    Dental no notable dental hx. (+) Teeth Intact, Dental Advisory Given   Pulmonary neg pulmonary ROS,    Pulmonary exam normal breath sounds clear to auscultation       Cardiovascular negative cardio ROS   Rhythm:Regular Rate:Normal     Neuro/Psych negative neurological ROS  negative psych ROS   GI/Hepatic negative GI ROS, Neg liver ROS,   Endo/Other  negative endocrine ROS  Renal/GU Renal disease  negative genitourinary   Musculoskeletal   Abdominal   Peds  Hematology negative hematology ROS (+)   Anesthesia Other Findings   Reproductive/Obstetrics negative OB ROS                             Anesthesia Physical Anesthesia Plan  ASA: II  Anesthesia Plan: MAC   Post-op Pain Management:    Induction: Intravenous  PONV Risk Score and Plan: 2 and Treatment may vary due to age or medical condition  Airway Management Planned: Simple Face Mask  Additional Equipment:   Intra-op Plan:   Post-operative Plan:   Informed Consent: I have reviewed the patients History and Physical, chart, labs and discussed the procedure including the risks, benefits and alternatives for the proposed anesthesia with the patient or authorized representative who has indicated his/her understanding and acceptance.   Dental advisory given  Plan Discussed with: CRNA and Surgeon  Anesthesia Plan Comments:         Anesthesia Quick Evaluation

## 2016-12-21 NOTE — Progress Notes (Signed)
Transcranial Doppler  Date POD PCO2 HCT BP  MCA ACA PCA OPHT SIPH VERT Basilar  12/21     Right  Left   38  46   -40  -29   -21     23  25    48  32   -29  -15              Right  Left                                            Right  Left                                             Right  Left                                             Right  Left                                            Right  Left                                            Right  Left                                        MCA = Middle Cerebral Artery      OPHT = Opthalmic Artery     BASILAR = Basilar Artery   ACA = Anterior Cerebral Artery     SIPH = Carotid Siphon PCA = Posterior Cerebral Artery   VERT = Verterbral Artery                   Normal MCA = 62+\-12 ACA = 50+\-12 PCA = 42+\-23   12/21/16- Unable to insonate basilar, right distal ICA, left terminal ICA or left PCA  due to patient agitation. -CB/RS  Levin Baconlaire Jannine Abreu- RDMS, RVT 4:06 PM  12/21/2016

## 2016-12-21 NOTE — Progress Notes (Signed)
Pt to Room- RN checked groin with RN, pulses wnl, groin WNL. IR team signing off

## 2016-12-21 NOTE — Anesthesia Postprocedure Evaluation (Signed)
Anesthesia Post Note  Patient: Trevor Perry  Procedure(s) Performed: RADIOLOGY WITH ANESTHESIA (N/A )     Patient location during evaluation: PACU Anesthesia Type: MAC Level of consciousness: awake and alert Pain management: pain level controlled Vital Signs Assessment: post-procedure vital signs reviewed and stable Respiratory status: spontaneous breathing, nonlabored ventilation, respiratory function stable and patient connected to nasal cannula oxygen Cardiovascular status: stable and blood pressure returned to baseline Postop Assessment: no apparent nausea or vomiting Anesthetic complications: no    Last Vitals:  Vitals:   12/21/16 1700 12/21/16 1730  BP: 121/69 119/69  Pulse: 87 88  Resp: 15 15  Temp:    SpO2: 99% 96%    Last Pain:  Vitals:   12/21/16 1600  TempSrc: Oral  PainSc:                  Caili Escalera,W. EDMOND

## 2016-12-21 NOTE — Transfer of Care (Signed)
Immediate Anesthesia Transfer of Care Note  Patient: Trevor Perry  Procedure(s) Performed: RADIOLOGY WITH ANESTHESIA (N/A )  Patient Location: NICU  Anesthesia Type:MAC  Level of Consciousness: awake, alert  and oriented  Airway & Oxygen Therapy: Patient Spontanous Breathing and Patient connected to nasal cannula oxygen  Post-op Assessment: Report given to RN and Post -op Vital signs reviewed and stable  Post vital signs: Reviewed and stable  Last Vitals:  Vitals:   12/21/16 1700 12/21/16 1730  BP: 121/69 119/69  Pulse: 87 88  Resp: 15 15  Temp:    SpO2: 99% 96%    Last Pain:  Vitals:   12/21/16 1600  TempSrc: Oral  PainSc:          Complications: No apparent anesthesia complications

## 2016-12-21 NOTE — Progress Notes (Signed)
MD called and made aware of head CT findings, no new orders. MD made aware of failed bedside swallow evaluation. This RN asked MD if NG tube should be placed in order to give Nimotop. MD declined at this time.

## 2016-12-22 ENCOUNTER — Other Ambulatory Visit: Payer: Self-pay

## 2016-12-22 NOTE — Progress Notes (Signed)
Overall stable.  No headache.  No complaints of numbness, paresthesias or weakness.  No seizures.  Afebrile.  Vital signs are stable.  Urine output good.  Drain output bloody but functioning well.  Approximately 10 cc/h.  Awake and alert.  Oriented and appropriate.  Motor 5/5 bilaterally.  Cranial nerve function intact.  Status post angiogram negative subarachnoid hemorrhage.  Continue external ventricular drainage and supportive care.

## 2016-12-22 NOTE — Evaluation (Signed)
Physical Therapy Evaluation Patient Details Name: Trevor Perry MRN: [id number] DOB: [birth year] Today's Date: 12/22/2016   History of Present Illness  66 [facility]. [facility] diffuse basal [facility], EVC placed 12/21. PMHx: back sx  Clinical Impression  Pt very pleasant and moving well. Noted deficits in short term memory and problem solving. Pt without notable strength or sensation deficits disoriented to day and unable to recall room number or cookies brother brought in during session. Pt moving well and no significant LOB with straight hall ambulation but will benefit from additional therapy session to further assess balance and stairs prior to return home. Recommend daily mobility with nursing staff.      Follow Up Recommendations No PT follow up    Equipment Recommendations  None recommended by PT    Recommendations for Other Services       Precautions / Restrictions Precautions Precaution Comments: EVD      Mobility  Bed Mobility Overal bed mobility: Modified Independent                Transfers Overall transfer level: Modified independent                  Ambulation/Gait Ambulation/Gait assistance: Supervision Ambulation Distance (Feet): 300 Feet Assistive device: None Gait Pattern/deviations: Step-through pattern;Decreased stride length   Gait velocity interpretation: Below normal speed for age/gender General Gait Details: pt with slow controlled gait and able to turn head without LOB  Stairs            Wheelchair Mobility    Modified Rankin (Stroke Patients Only) Modified Rankin (Stroke Patients Only) Pre-Morbid Rankin Score: No symptoms Modified Rankin: No significant disability     Balance Overall balance assessment: No apparent balance deficits (not formally assessed)                                           Pertinent Vitals/Pain Pain Assessment: 0-10 Pain Score: 4  Pain  Location: headache Pain Intervention(s): Limited activity within patient's tolerance;Repositioned;Monitored during session    Home Living Family/patient expects to be discharged to:: Private residence Living Arrangements: Spouse/significant other Available Help at Discharge: Family;Available 24 hours/day Type of Home: House Home Access: Stairs to enter   [employer] CorporationEntrance Stairs-Number of Steps: 8 Home Layout: Multi-level;Bed/bath upstairs Home Equipment: None      Prior Function Level of Independence: Independent         Comments: works with grading     Hand Dominance   Dominant Hand: Right    Extremity/Trunk Assessment   Upper Extremity Assessment Upper Extremity Assessment: Defer to OT evaluation    Lower Extremity Assessment Lower Extremity Assessment: Overall WFL for tasks assessed(5/5 bil LE)    Cervical / Trunk Assessment Cervical / Trunk Assessment: Normal  Communication   Communication: No difficulties  Cognition Arousal/Alertness: Awake/alert Behavior During Therapy: WFL for tasks assessed/performed Overall Cognitive Status: Impaired/Different from baseline Area of Impairment: Memory                     Memory: Decreased short-term memory         General Comments: pt unable to recall room number within 2 minutes or recall what was on breakfast tray after being told      General Comments      Exercises     Assessment/Plan  PT Assessment Patient needs continued PT services  PT Problem List Decreased balance;Decreased cognition       PT Treatment Interventions Stair training;Balance training;Cognitive remediation;Therapeutic activities;Patient/family education    PT Goals (Current goals can be found in the Care Plan section)  Acute Rehab PT Goals Patient Stated Goal: return to work next week PT Goal Formulation: With patient Time For Goal Achievement: 01/05/17 Potential to Achieve Goals: Good    Frequency Min 3X/week   Barriers  to discharge        Co-evaluation PT/OT/SLP Co-Evaluation/Treatment: Yes Reason for Co-Treatment: Complexity of the patient's impairments (multi-system involvement)           AM-PAC PT "6 Clicks" Daily Activity  Outcome Measure Difficulty turning over in bed (including adjusting bedclothes, sheets and blankets)?: None Difficulty moving from lying on back to sitting on the side of the bed? : None Difficulty sitting down on and standing up from a chair with arms (e.g., wheelchair, bedside commode, etc,.)?: A Little Help needed moving to and from a bed to chair (including a wheelchair)?: A Little Help needed walking in hospital room?: A Little Help needed climbing 3-5 steps with a railing? : A Little 6 Click Score: 20    End of Session Equipment Utilized During Treatment: Gait belt Activity Tolerance: Patient tolerated treatment well Patient left: in chair;with call bell/phone within reach   PT Visit Diagnosis: Other abnormalities of gait and mobility (R26.89)    Time: 4098-11911055-1115 PT Time Calculation (min) (ACUTE ONLY): 20 min   Charges:   PT Evaluation $PT Eval Moderate Complexity: 1 Mod     PT G Codes:        Delaney MeigsMaija Tabor Nikkia Devoss, PT [phone]   Hollynn Garno B Aleaya Latona 12/22/2016, 11:18 AM

## 2016-12-22 NOTE — Progress Notes (Signed)
PT Cancellation Note  Patient Details Name: Trevor Perry MRN: [id number] DOB: 08-Sep-1950   Cancelled Treatment:    Reason Eval/Treat Not Completed: Medical issues which prohibited therapy(continue to await increased activity order from bedrest)   Ai Sonnenfeld B Jahshua Bonito 12/22/2016, 7:08 AM  Delaney MeigsMaija Tabor Anielle Headrick, PT [phone]

## 2016-12-22 NOTE — Evaluation (Signed)
Speech Language Pathology Evaluation Patient Details Name: Trevor Perry MRN: [id number] DOB: March 03, 1950 Today's Date: 12/22/2016 Time: [id number] SLP Time Calculation (min) (ACUTE ONLY): 10 min  Problem List:  Patient Active Problem List   Diagnosis Date Noted  . Subarachnoid hemorrhage (HCC) 12/20/2016   Past Medical History:  Past Medical History:  Diagnosis Date  . Kidney stone    Past Surgical History:  Past Surgical History:  Procedure Laterality Date  . BACK SURGERY    . IR ANGIO INTRA EXTRACRAN SEL INTERNAL CAROTID BILAT MOD SED  12/21/2016  . IR ANGIO VERTEBRAL SEL VERTEBRAL BILAT MOD SED  12/21/2016   HPI:  66 y.o.maleadmitted with subarachnoid hemorrhage and progressive lethargy with serial CT demonstrating progressive ventriculomegaly indicating hydrocephalus. EVD 12/21.   Assessment / Plan / Recommendation Clinical Impression  Pt presents with normal speech and language; fluent output; mild deficits in short-term/working memory.  SLP will follow briefly for further cognitive assessment and to determine f/u needs post-acute care, if warranted.     SLP Assessment  SLP Recommendation/Assessment: Patient needs continued Speech Lanaguage Pathology Services SLP Visit Diagnosis: Dysphagia, unspecified (R13.10)    Follow Up Recommendations  Other (comment)(tba)    Frequency and Duration min 2x/week  1 week      SLP Evaluation Cognition  Overall Cognitive Status: Impaired/Different from baseline Orientation Level: Oriented X4 Attention: Selective Selective Attention: Appears intact Memory: Impaired Memory Impairment: Storage deficit;Retrieval deficit Awareness: Appears intact Problem Solving: Appears intact Safety/Judgment: Appears intact       Comprehension  Auditory Comprehension Overall Auditory Comprehension: Appears within functional limits for tasks assessed    Expression Expression Primary Mode of Expression: Verbal Verbal Expression Overall  Verbal Expression: Appears within functional limits for tasks assessed Written Expression Dominant Hand: Right   Oral / Motor  Oral Motor/Sensory Function Overall Oral Motor/Sensory Function: Within functional limits Motor Speech Overall Motor Speech: Appears within functional limits for tasks assessed   GO                    Trevor Perry, Trevor Perry 12/22/2016, 9:45 AM

## 2016-12-22 NOTE — Evaluation (Signed)
Clinical/Bedside Swallow Evaluation Patient Details  Name: Trevor Perry MRN: [id number] Date of Birth: 08-Jun-1950  Today's Date: 12/22/2016 Time: SLP Start Time (ACUTE ONLY): 0920 SLP Stop Time (ACUTE ONLY): 0929 SLP Time Calculation (min) (ACUTE ONLY): 9 min  Past Medical History:  Past Medical History:  Diagnosis Date  . Kidney stone    Past Surgical History:  Past Surgical History:  Procedure Laterality Date  . BACK SURGERY    . IR ANGIO INTRA EXTRACRAN SEL INTERNAL CAROTID BILAT MOD SED  12/21/2016  . IR ANGIO VERTEBRAL SEL VERTEBRAL BILAT MOD SED  12/21/2016   HPI:  66 y.o.maleadmitted with subarachnoid hemorrhage and progressive lethargy with serial CT demonstrating progressive ventriculomegaly indicating hydrocephalus. EVD 12/21.   Assessment / Plan / Recommendation Clinical Impression  Pt presents with normal oropharyngeal swallow with adequate mastication, brisk swallow response, and no overt s/s of aspiration.  He passed the three oz water test without difficulty.  Recommend advancing to a regular diet, thin liquids.  No SLP f/u warranted.  SLP Visit Diagnosis: Dysphagia, unspecified (R13.10)    Aspiration Risk  No limitations    Diet Recommendation   regular solids, thin liquids  Medication Administration: Whole meds with liquid    Other  Recommendations Oral Care Recommendations: Oral care BID   Follow up Recommendations None      Frequency and Duration            Prognosis        Swallow Study   General Date of Onset: 12/20/16 HPI: 66 y.o.maleadmitted with subarachnoid hemorrhage and progressive lethargy with serial CT demonstrating progressive ventriculomegaly indicating hydrocephalus. EVD 12/21. Type of Study: Bedside Swallow Evaluation Previous Swallow Assessment: no Diet Prior to this Study: NPO Temperature Spikes Noted: No Respiratory Status: Room air History of Recent Intubation: No Behavior/Cognition: Alert;Cooperative Oral Cavity  Assessment: Within Functional Limits Oral Care Completed by SLP: No Oral Cavity - Dentition: Adequate natural dentition Vision: Functional for self-feeding Self-Feeding Abilities: Able to feed self Patient Positioning: Upright in bed Baseline Vocal Quality: Normal Volitional Cough: Strong Volitional Swallow: Able to elicit    Oral/Motor/Sensory Function Overall Oral Motor/Sensory Function: Within functional limits   Ice Chips Ice chips: Within functional limits   Thin Liquid Thin Liquid: Within functional limits    Nectar Thick Nectar Thick Liquid: Not tested   Honey Thick Honey Thick Liquid: Not tested   Puree Puree: Within functional limits   Solid   GO   Solid: Within functional limits        Blenda MountsCouture, Lakshmi Sundeen Laurice 12/22/2016,9:40 AM

## 2016-12-22 NOTE — Evaluation (Signed)
Occupational Therapy Evaluation Patient Details Name: Trevor Perry MRN: [id number] DOB: [birth year] Today's Date: 12/22/2016    History of Present Illness 66 y.[facility]. [facility] diffuse [facility], EVC placed 12/21. PMHx: back sx   Clinical Impression   This 66 yo male admitted with above presents to acute OT at an overall S level and should progress quickly back to independence. Will follow acutely without need for OT follow up beyond acute care.     Follow Up Recommendations  No OT follow up;Supervision/Assistance - 24 hour    Equipment Recommendations  None recommended by OT       Precautions / Restrictions Precautions Precaution Comments: ventricular drain Restrictions Weight Bearing Restrictions: No      Mobility Bed Mobility Overal bed mobility: Modified Independent                Transfers Overall transfer level: Modified independent                    Balance Overall balance assessment: No apparent balance deficits (not formally assessed)                                         ADL either performed or assessed with clinical judgement   ADL Overall ADL's : Needs assistance/impaired Eating/Feeding: Independent;Sitting   Grooming: Standing;Supervision/safety   Upper Body Bathing: Sitting   Lower Body Bathing: Sit to/from stand;Supervison/ safety   Upper Body Dressing : Set up;Sitting   Lower Body Dressing: Sit to/from stand;Supervision/safety   Toilet Transfer: Ambulation;Supervision/safety   Toileting- Clothing Manipulation and Hygiene: Supervision/safety;Sitting/lateral lean               Vision Baseline Vision/History: Wears glasses Wears Glasses: Reading only Patient Visual Report: No change from baseline Vision Assessment?: Yes Eye Alignment: Within Functional Limits Ocular Range of Motion: Within Functional Limits Alignment/Gaze Preference: Within Defined  Limits Tracking/Visual Pursuits: Able to track stimulus in all quads without difficulty Saccades: Within functional limits Convergence: Within functional limits Visual Fields: No apparent deficits            Pertinent Vitals/Pain Pain Assessment: 0-10 Pain Score: 4  Pain Location: headache Pain Intervention(s): Limited activity within patient's tolerance;Repositioned;Monitored during session     Hand Dominance Right   Extremity/Trunk Assessment Upper Extremity Assessment Upper Extremity Assessment: Overall WFL for tasks assessed   Lower Extremity Assessment Lower Extremity Assessment: Overall WFL for tasks assessed(5/5 bil LE)   Cervical / Trunk Assessment Cervical / Trunk Assessment: Normal   Communication Communication Communication: No difficulties   Cognition Arousal/Alertness: Awake/alert Behavior During Therapy: WFL for tasks assessed/performed Overall Cognitive Status: Impaired/Different from baseline Area of Impairment: Memory                     Memory: Decreased short-term memory         General Comments: pt unable to recall room number within 2 minutes or recall what was on breakfast tray after being told              Home Living Family/patient expects to be discharged to:: Private residence Living Arrangements: Spouse/significant other Available Help at Discharge: Family;Available 24 hours/day Type of Home: House Home Access: Stairs to enter [employer] CorporationEntrance Stairs-Number of Steps: 8   Home Layout: Multi-level;Bed/bath upstairs Alternate Level Stairs-Number of Steps: 13   Bathroom Shower/Tub: Curtain  Bathroom Toilet: Standard     Home Equipment: None      Lives With: Spouse    Prior Functioning/Environment Level of Independence: Independent        Comments: works with grading         OT Problem List: Impaired balance (sitting and/or standing)      OT Treatment/Interventions: Self-care/ADL training;Balance training;DME  and/or AE instruction;Patient/family education    OT Goals(Current goals can be found in the care plan section) Acute Rehab OT Goals Patient Stated Goal: "return to work next week or maybe later we will see" OT Goal Formulation: With patient Time For Goal Achievement: 12/29/16 Potential to Achieve Goals: Good  OT Frequency: Min 2X/week           Co-evaluation PT/OT/SLP Co-Evaluation/Treatment: Yes Reason for Co-Treatment: Complexity of the patient's impairments (multi-system involvement)          AM-PAC PT "6 Clicks" Daily Activity     Outcome Measure Help from another person eating meals?: None Help from another person taking care of personal grooming?: A Little Help from another person toileting, which includes using toliet, bedpan, or urinal?: A Little Help from another person bathing (including washing, rinsing, drying)?: A Little Help from another person to put on and taking off regular upper body clothing?: A Little Help from another person to put on and taking off regular lower body clothing?: A Little 6 Click Score: 19   End of Session Equipment Utilized During Treatment: Gait belt Nurse Communication: (therapy finished with pt so ventric drain could be unclamped)  Activity Tolerance: Patient tolerated treatment well Patient left: in chair;with call bell/phone within reach;with chair alarm set  OT Visit Diagnosis: Unsteadiness on feet (R26.81)                Time: [id number] OT Time Calculation (min): 21 min Charges:  OT General Charges $OT Visit: 1 Visit OT Evaluation $OT Eval Moderate Complexity: [address]1 Mod Ignacia PalmaCathy Anatasia Tino, [place] [phone] 12/22/2016

## 2016-12-23 MED ORDER — LEVETIRACETAM 500 MG PO TABS
500.0000 mg | ORAL_TABLET | Freq: Two times a day (BID) | ORAL | Status: DC
  Administered 2016-12-23 – 2017-01-03 (×22): 500 mg via ORAL
  Filled 2016-12-23 (×22): qty 1

## 2016-12-23 NOTE — Progress Notes (Signed)
Subjective: Patient sitting up in bedside chair. IVC continuing to function well. CSF remains moderately bloody. Has some headache, requiring medication.  Objective: Vital signs in last 24 hours: Vitals:   12/23/16 0600 12/23/16 0700 12/23/16 0800 12/23/16 0900  BP: 123/73 140/76 135/84   Pulse: 65 68 61   Resp: 14 16 12 13   Temp:   97.8 F (36.6 C)   TempSrc:   Axillary   SpO2: 99% 99% 99%   Weight:      Height:        Intake/Output from previous day: 12/22 0701 - 12/23 0700 In: 2930 [P.O.:320; I.V.:2400; IV Piggyback:210] Out: 856 [Urine:575; Drains:281] Intake/Output this shift: Total I/O In: 200 [I.V.:200] Out: 30 [Drains:30]  Physical Exam:  Awake and alert, oriented. Following commands. Moving all 4 extremity is well.  CBC Recent Labs    12/20/16 2030  WBC 5.7  HGB 14.6  HCT 42.3  PLT 196   BMET Recent Labs    12/20/16 2030  NA 137  K 3.4*  CL 102  CO2 25  GLUCOSE 110*  BUN 20  CREATININE 0.99  CALCIUM 8.9    Studies/Results: Ir Angio Intra Extracran Sel Internal Carotid Bilat Mod Sed  Result Date: 12/21/2016 PROCEDURE: DIAGNOSTIC CEREBRAL ANGIOGRAM HISTORY: The patient is a 66 year old man initially presenting to the emergency department with sudden onset of severe headache. CT scan demonstrated diffuse basal subarachnoid hemorrhage. The patient therefore presents for further workup with diagnostic cerebral angiogram. ACCESS: The technical aspects of the procedure as well as its potential risks and benefits were reviewed with the patient's family. These risks included but were not limited bleeding, infection, allergic reaction, damage to organs or vital structures, stroke, non-diagnostic procedure, and the catastrophic outcomes of heart attack, coma, and death. With an understanding of these risks, informed consent was obtained and witnessed. The patient was placed in the supine position on the angiography table and the skin of right groin prepped in the  usual sterile fashion. The procedure was performed under local anesthesia (1%-solution of bicarbonate-buffered Lidocaine) and conscious sedation with Versed and fentanyl monitored by the in-suite nurse. A 5- French sheath was introduced in the right common femoral artery using Seldinger technique. A fluoro-phase sequence was used to document the sheath position. MEDICATIONS: HEPARIN: 0 units total. CONTRAST:  cc, Omnipaque 300 FLUOROSCOPY TIME:  FLUOROSCOPY TIME:  See IR records TECHNIQUE: CATHETERS AND WIRES 5-French JB-1 catheter 0.035" glidewire VESSELS CATHETERIZED Right internal carotid Left internal carotid Left vertebral Right vertebral Right common femoral VESSELS STUDIED Right internal carotid, head Left internal carotid, head Left vertebral Right vertebral Right femoral PROCEDURAL NARRATIVE A 5-Fr JB-1 catheter was advanced over a 0.035 glidewire into the aortic arch. The above vessels were then sequentially catheterized and cervical / cerebral angiograms taken. After review of images, the catheter was removed without incident. FINDINGS: Right internal carotid: head: Injection reveals the presence of a widely patent ICA, M1, and A1 segments and their branches. No aneurysms, AVMs, or high-flow fistulas are seen. The parenchymal and venous phases are normal. The venous sinuses are widely patent. Left internal carotid: head: Injection reveals the presence of a widely patent ICA, A1, and M1 segments and their branches. No aneurysms, AVMs, or high-flow fistulas are seen. The parenchymal and venous phases are normal. The venous sinuses are widely patent. Right vertebral: Injection reveals the presence of a widely patent vertebral artery. This leads to a widely patent basilar artery that terminates in bilateral P1. The basilar apex is  normal. No aneurysms, AVMs, or high-flow fistulas are seen. The parenchymal and venous phases are normal. The venous sinuses are widely patent. Left vertebral: The vertebral artery  is widely patent. No PICA aneurysm is seen. See basilar description above. Right femoral: Normal vessel. No significant atherosclerotic disease. Arterial sheath in adequate position. DISPOSITION: Upon completion of the study, the femoral sheath was removed and hemostasis obtained using a 5-Fr ExoSeal closure device. Good proximal and distal lower extremity pulses were documented upon achievement of hemostasis. The procedure was well tolerated and no early complications were observed. The patient was transferred to the neuro ICU for further care. IMPRESSION: 1. Normal cerebral angiogram, without angiographic evidence for intracranial aneurysm, arteriovenous malformation, or high-flow fistula. The preliminary results of this procedure were shared with the patient and the patient's family. Electronically Signed   By: Lisbeth RenshawNeelesh  Nundkumar   On: 12/21/2016 19:37   Ir Angio Vertebral Sel Vertebral Bilat Mod Sed  Result Date: 12/21/2016 PROCEDURE: DIAGNOSTIC CEREBRAL ANGIOGRAM HISTORY: The patient is a 66 year old man initially presenting to the emergency department with sudden onset of severe headache. CT scan demonstrated diffuse basal subarachnoid hemorrhage. The patient therefore presents for further workup with diagnostic cerebral angiogram. ACCESS: The technical aspects of the procedure as well as its potential risks and benefits were reviewed with the patient's family. These risks included but were not limited bleeding, infection, allergic reaction, damage to organs or vital structures, stroke, non-diagnostic procedure, and the catastrophic outcomes of heart attack, coma, and death. With an understanding of these risks, informed consent was obtained and witnessed. The patient was placed in the supine position on the angiography table and the skin of right groin prepped in the usual sterile fashion. The procedure was performed under local anesthesia (1%-solution of bicarbonate-buffered Lidocaine) and conscious  sedation with Versed and fentanyl monitored by the in-suite nurse. A 5- French sheath was introduced in the right common femoral artery using Seldinger technique. A fluoro-phase sequence was used to document the sheath position. MEDICATIONS: HEPARIN: 0 units total. CONTRAST:  cc, Omnipaque 300 FLUOROSCOPY TIME:  FLUOROSCOPY TIME:  See IR records TECHNIQUE: CATHETERS AND WIRES 5-French JB-1 catheter 0.035" glidewire VESSELS CATHETERIZED Right internal carotid Left internal carotid Left vertebral Right vertebral Right common femoral VESSELS STUDIED Right internal carotid, head Left internal carotid, head Left vertebral Right vertebral Right femoral PROCEDURAL NARRATIVE A 5-Fr JB-1 catheter was advanced over a 0.035 glidewire into the aortic arch. The above vessels were then sequentially catheterized and cervical / cerebral angiograms taken. After review of images, the catheter was removed without incident. FINDINGS: Right internal carotid: head: Injection reveals the presence of a widely patent ICA, M1, and A1 segments and their branches. No aneurysms, AVMs, or high-flow fistulas are seen. The parenchymal and venous phases are normal. The venous sinuses are widely patent. Left internal carotid: head: Injection reveals the presence of a widely patent ICA, A1, and M1 segments and their branches. No aneurysms, AVMs, or high-flow fistulas are seen. The parenchymal and venous phases are normal. The venous sinuses are widely patent. Right vertebral: Injection reveals the presence of a widely patent vertebral artery. This leads to a widely patent basilar artery that terminates in bilateral P1. The basilar apex is normal. No aneurysms, AVMs, or high-flow fistulas are seen. The parenchymal and venous phases are normal. The venous sinuses are widely patent. Left vertebral: The vertebral artery is widely patent. No PICA aneurysm is seen. See basilar description above. Right femoral: Normal vessel. No significant atherosclerotic  disease. Arterial sheath in adequate position. DISPOSITION: Upon completion of the study, the femoral sheath was removed and hemostasis obtained using a 5-Fr ExoSeal closure device. Good proximal and distal lower extremity pulses were documented upon achievement of hemostasis. The procedure was well tolerated and no early complications were observed. The patient was transferred to the neuro ICU for further care. IMPRESSION: 1. Normal cerebral angiogram, without angiographic evidence for intracranial aneurysm, arteriovenous malformation, or high-flow fistula. The preliminary results of this procedure were shared with the patient and the patient's family. Electronically Signed   By: Lisbeth RenshawNeelesh  Nundkumar   On: 12/21/2016 19:37    Assessment/Plan: Continue IVC drainage. Continue supportive care. Spoke with patient, his wife, and his son regarding his condition and her plans for continued treatment and care.   Hewitt ShortsNUDELMAN,ROBERT W, MD 12/23/2016, 9:59 AM

## 2016-12-24 ENCOUNTER — Encounter (HOSPITAL_COMMUNITY): Payer: Self-pay | Admitting: Radiology

## 2016-12-24 ENCOUNTER — Inpatient Hospital Stay (HOSPITAL_COMMUNITY): Payer: Medicare HMO

## 2016-12-24 DIAGNOSIS — I609 Nontraumatic subarachnoid hemorrhage, unspecified: Secondary | ICD-10-CM

## 2016-12-24 NOTE — Progress Notes (Signed)
Physical Therapy Treatment Patient Details Name: Trevor ApplebaumRoy Klahr MRN: [id number] DOB: July 24, 1950 Today's Date: 12/24/2016    History of Present Illness 66 [facility]. [facility], EVC placed 12/21. PMHx: back sx    PT Comments    Pt pleasant and continues to demonstrate deficits in STM and activity tolerance. Pt needs to practice stairs but unable with EVC still connected and pt too fatigued after gait to attempt if able. Pt educated for memory deficits and need to use energy conservation currently. Wife present and aware. Will continue to follow with encouragement for daily activity with nursing.     Follow Up Recommendations  No PT follow up     Equipment Recommendations  None recommended by PT    Recommendations for Other Services       Precautions / Restrictions Precautions Precaution Comments: ventricular drain can be clamped 15min at a time    Mobility  Bed Mobility Overal bed mobility: Modified Independent                Transfers Overall transfer level: Needs assistance   Transfers: Sit to/from Stand Sit to Stand: Supervision         General transfer comment: for lines only  Ambulation/Gait Ambulation/Gait assistance: Supervision Ambulation Distance (Feet): 300 Feet Assistive device: None Gait Pattern/deviations: Step-through pattern;Decreased stride length   Gait velocity interpretation: Below normal speed for age/gender General Gait Details: pt with slow controlled gait and able to turn head without LOB, fatigues quickly and unable to tolerate further activity. Pleasant with decreased gait speed today   Stairs            Wheelchair Mobility    Modified Rankin (Stroke Patients Only) Modified Rankin (Stroke Patients Only) Pre-Morbid Rankin Score: No symptoms Modified Rankin: No significant disability     Balance Overall balance assessment: No apparent balance deficits (not  formally assessed)                                          Cognition Arousal/Alertness: Awake/alert Behavior During Therapy: WFL for tasks assessed/performed Overall Cognitive Status: Impaired/Different from baseline Area of Impairment: Memory                     Memory: Decreased short-term memory         General Comments: pt unable to recall room number in room but able to recall with gait after education and utilize to find room. Pt unable to recall previous therapy sessions or visitors acutely      Exercises      General Comments        Pertinent Vitals/Pain Pain Score: 5  Pain Location: headache and gluteal fold Pain Descriptors / Indicators: Aching Pain Intervention(s): Limited activity within patient's tolerance;Repositioned;Monitored during session    Home Living                      Prior Function            PT Goals (current goals can now be found in the care plan section) Progress towards PT goals: Progressing toward goals    Frequency    Min 3X/week      PT Plan Current plan remains appropriate    Co-evaluation              AM-PAC  PT "6 Clicks" Daily Activity  Outcome Measure  Difficulty turning over in bed (including adjusting bedclothes, sheets and blankets)?: None Difficulty moving from lying on back to sitting on the side of the bed? : None Difficulty sitting down on and standing up from a chair with arms (e.g., wheelchair, bedside commode, etc,.)?: A Little Help needed moving to and from a bed to chair (including a wheelchair)?: A Little Help needed walking in hospital room?: A Little Help needed climbing 3-5 steps with a railing? : A Little 6 Click Score: 20    End of Session Equipment Utilized During Treatment: Gait belt Activity Tolerance: Patient tolerated treatment well Patient left: in chair;with call bell/phone within reach;with family/visitor present Nurse Communication: Mobility  status PT Visit Diagnosis: Other abnormalities of gait and mobility (R26.89)     Time: 4540-98111015-1035 PT Time Calculation (min) (ACUTE ONLY): 20 min  Charges:  $Gait Training: 8-22 mins                    G Codes:       Delaney MeigsMaija Tabor Christinna Sprung, PT [phone]    Pattie Flaharty B Mithran Strike 12/24/2016, 12:20 PM

## 2016-12-24 NOTE — Progress Notes (Signed)
Transcranial Doppler  Date POD PCO2 HCT BP  MCA ACA PCA OPHT SIPH VERT Basilar  12/21 CB     Right  Left   38  46   -40  -29   -21     23  25    48  32   -29  -15   *      12/24 JE     Right  Left   55  72   -62  -60   -46  12   19  24    66  40   *  -23   *           Right  Left                                             Right  Left                                             Right  Left                                            Right  Left                                            Right  Left                                        MCA = Middle Cerebral Artery      OPHT = Opthalmic Artery     BASILAR = Basilar Artery   ACA = Anterior Cerebral Artery     SIPH = Carotid Siphon PCA = Posterior Cerebral Artery   VERT = Verterbral Artery                   Normal MCA = 62+\-12 ACA = 50+\-12 PCA = 42+\-23   12/21/16- Unable to insonate basilar, right distal ICA, left terminal ICA or left PCA  due to patient agitation. -CB/RS   12:14 PM  12/24/2016

## 2016-12-24 NOTE — Progress Notes (Signed)
Pt seen and examined. No issues overnight. Has some HA this am.  EXAM: Temp:  [97.6 F (36.4 C)-98.3 F (36.8 C)] 98.3 F (36.8 C) (12/24 0800) Pulse Rate:  [56-77] 71 (12/24 0800) Resp:  [11-24] 17 (12/24 0800) BP: (110-158)/(20-91) 147/91 (12/24 0800) SpO2:  [87 %-100 %] 95 % (12/24 0800) Intake/Output      12/23 0701 - 12/24 0700 12/24 0701 - 12/25 0700   P.O. 600    I.V. (mL/kg) 2463.2 (24)    IV Piggyback 105    Total Intake(mL/kg) 3168.2 (30.9)    Urine (mL/kg/hr) 0 (0)    Drains 279    Total Output 279    Net +2889.2         Urine Occurrence 4 x     Awake, alert, oriented Speech fluent CN intact Good strength throughout EVD draining blood tinged CSF  LABS: Lab Results  Component Value Date   CREATININE 0.99 12/20/2016   BUN 20 12/20/2016   NA 137 12/20/2016   K 3.4 (L) 12/20/2016   CL 102 12/20/2016   CO2 25 12/20/2016   Lab Results  Component Value Date   WBC 5.7 12/20/2016   HGB 14.6 12/20/2016   HCT 42.3 12/20/2016   MCV 88.9 12/20/2016   PLT 196 12/20/2016     IMPRESSION: - 66 y.o. male angio neg SAH d# 4, doing well  PLAN: - Cont CSF drainage  - Cont supportive care - Would allow SBP up to 180mmHg - TCD monitoring

## 2016-12-24 NOTE — Progress Notes (Signed)
Occupational Therapy Treatment Patient Details Name: Trevor ApplebaumRoy Pasko MRN: [id number] DOB: [birth year] Today's Date: 12/24/2016    History of present illness 66 [facility]. [facility], EVC placed 12/21. PMHx: back sx   OT comments  Pt feeling more weak and nauseous during session and benefited from close supervision- Min guard A for safety. Pt continues to demonstrate decreased ST memory. Pt requiring increased time and Min VCs to recall ADLs tasks asked for him to perform. Pt performing toileting, oral care, and functional mobility with Min Guard A. Will continue to follow acutely and continue to recommend dc home when medically stable per physician.   Follow Up Recommendations  No OT follow up;Supervision/Assistance - 24 hour    Equipment Recommendations  None recommended by OT    Recommendations for Other Services      Precautions / Restrictions Precautions Precautions: Fall;Other (comment) Precaution Comments: ventricular drain can be clamped 15min at a time Restrictions Weight Bearing Restrictions: No       Mobility Bed Mobility Overal bed mobility: Needs Assistance Bed Mobility: Supine to Sit;Sit to Supine     Supine to sit: Min assist Sit to supine: Supervision   General bed mobility comments: Pt requiring Min A after sitting upright due to nasuea  Transfers Overall transfer level: Needs assistance Equipment used: None Transfers: Sit to/from Stand Sit to Stand: Supervision;Min guard         General transfer comment: for lines only    Balance Overall balance assessment: No apparent balance deficits (not formally assessed)                                         ADL either performed or assessed with clinical judgement   ADL Overall ADL's : Needs assistance/impaired     Grooming: Standing;Oral care;Wash/dry face;Wash/dry hands;Min guard                   StatisticianToilet Transfer:  Ambulation;Min Furniture conservator/restorerguard   Toileting- Clothing Manipulation and Hygiene: Sitting/lateral lean;Supervision/safety       Functional mobility during ADLs: Min guard General ADL Comments: Pt needing Min Guard A throughout session due to feeling nauseous and weak.     Vision   Vision Assessment?: Yes Eye Alignment: Within Functional Limits Ocular Range of Motion: Within Functional Limits Alignment/Gaze Preference: Within Defined Limits Tracking/Visual Pursuits: Able to track stimulus in all quads without difficulty Saccades: Within functional limits Convergence: Within functional limits Visual Fields: No apparent deficits   Perception     Praxis      Cognition Arousal/Alertness: Awake/alert Behavior During Therapy: WFL for tasks assessed/performed Overall Cognitive Status: Impaired/Different from baseline Area of Impairment: Memory                     Memory: Decreased short-term memory         General Comments: Pt requiring increased time to recall 3 ADL tasks asked for him to perform. Pt requiring Min VCs to recall 3/3 and usually was able to remember 2/3. Pt throughout session would ask questions such as "ok, where to now?" or "what do you want me to do next?"        Exercises     Shoulder Instructions       General Comments Son present throughout session. BP at EOB before activity 160/86    Pertinent  Vitals/ Pain       Pain Assessment: Faces Faces Pain Scale: Hurts even more Pain Location: headache Pain Descriptors / Indicators: Aching Pain Intervention(s): Monitored during session;Limited activity within patient's tolerance;Repositioned;Premedicated before session  Home Living                                          Prior Functioning/Environment              Frequency  Min 2X/week        Progress Toward Goals  OT Goals(current goals can now be found in the care plan section)  Progress towards OT goals: Progressing  toward goals (Pt requiring increased assistance this treatment due to nausea)  Acute Rehab OT Goals Patient Stated Goal: "return to work next week or maybe later we will see" OT Goal Formulation: With patient Time For Goal Achievement: 12/29/16 Potential to Achieve Goals: Good ADL Goals Pt Will Perform Grooming: Independently;standing Pt Will Perform Upper Body Dressing: Independently;sitting;standing Pt Will Perform Lower Body Dressing: Independently;sit to/from stand Pt Will Transfer to Toilet: Independently;ambulating;regular height toilet Pt Will Perform Toileting - Clothing Manipulation and hygiene: Independently;sit to/from stand Additional ADL Goal #1: pt will be able to path find without vcs  Plan Discharge plan remains appropriate    Co-evaluation                 AM-PAC PT "6 Clicks" Daily Activity     Outcome Measure   Help from another person eating meals?: None Help from another person taking care of personal grooming?: A Little Help from another person toileting, which includes using toliet, bedpan, or urinal?: A Little Help from another person bathing (including washing, rinsing, drying)?: A Little Help from another person to put on and taking off regular upper body clothing?: A Little Help from another person to put on and taking off regular lower body clothing?: A Little 6 Click Score: 19    End of Session Equipment Utilized During Treatment: Gait belt  OT Visit Diagnosis: Unsteadiness on feet (R26.81)   Activity Tolerance Patient tolerated treatment well   Patient Left with call bell/phone within reach;in bed;with family/visitor present   Nurse Communication Mobility status(therapy finished with pt so ventric drain could be unclamped)        Time: 7829-56211500-1537 OT Time Calculation (min): 37 min  Charges: OT General Charges $OT Visit: 1 Visit OT Treatments $Self Care/Home Management : 23-37 mins  Yarelli Decelles MSOT, OTR/L Acute Rehab Pager:  [phone] Office: [phone]   Theodoro GristCharis M Toren Tucholski 12/24/2016, 5:07 PM

## 2016-12-25 NOTE — Progress Notes (Signed)
Patient ID: Trevor ApplebaumRoy Perry, male   DOB: 1950/02/18, 66 y.o.   MRN: [id number] Subjective: Patient reports no headache  Objective: Vital signs in last 24 hours: Temp:  [97.6 F (36.4 C)-99.5 F (37.5 C)] 99.5 F (37.5 C) (12/25 0800) Pulse Rate:  [62-80] 74 (12/25 0700) Resp:  [13-24] 24 (12/25 0700) BP: (132-163)/(63-85) 160/85 (12/25 0700) SpO2:  [90 %-98 %] 95 % (12/25 0700)  Intake/Output from previous day: 12/24 0701 - 12/25 0700 In: 2465.2 [I.V.:2465.2] Out: 346 [Drains:346] Intake/Output this shift: No intake/output data recorded.  Neurologic: Grossly normal, ventric patent  Lab Results: Lab Results  Component Value Date   WBC 5.7 12/20/2016   HGB 14.6 12/20/2016   HCT 42.3 12/20/2016   MCV 88.9 12/20/2016   PLT 196 12/20/2016   No results found for: INR, PROTIME BMET Lab Results  Component Value Date   NA 137 12/20/2016   K 3.4 (L) 12/20/2016   CL 102 12/20/2016   CO2 25 12/20/2016   GLUCOSE 110 (H) 12/20/2016   BUN 20 12/20/2016   CREATININE 0.99 12/20/2016   CALCIUM 8.9 12/20/2016    Studies/Results: No results found.  Assessment/Plan: Doing well, continue ventriculostomy   LOS: 5 days    Ahnaf Caponi S 12/25/2016, 11:52 AM

## 2016-12-26 ENCOUNTER — Inpatient Hospital Stay (HOSPITAL_COMMUNITY): Payer: Medicare HMO

## 2016-12-26 DIAGNOSIS — I609 Nontraumatic subarachnoid hemorrhage, unspecified: Secondary | ICD-10-CM

## 2016-12-26 NOTE — Progress Notes (Signed)
Transcranial Doppler  Date POD PCO2 HCT BP  MCA ACA PCA OPHT SIPH VERT Basilar  12/21 CB     Right  Left   38  46   -40  -29   -21     23  25    48  32   -29  -15   *      12/24 JE     Right  Left   55  72   -62  -60   -46  12   19  24    66  40   *  -23   *      12/26/16 MS     Right  Left   38  65   -19  -24   *  10   22  22    66  36   -11  -11   *            Right  Left                                             Right  Left                                            Right  Left                                            Right  Left                                        MCA = Middle Cerebral Artery      OPHT = Opthalmic Artery     BASILAR = Basilar Artery   ACA = Anterior Cerebral Artery     SIPH = Carotid Siphon PCA = Posterior Cerebral Artery   VERT = Verterbral Artery                   Normal MCA = 62+\-12 ACA = 50+\-12 PCA = 42+\-23   12/21/16- Unable to insonate basilar, right distal ICA, left terminal ICA or left PCA  due to patient agitation. -CB/RS  12/26/16- *Unable to insonate. Right [place] ratio=1.19, left [place] ratio=1.91.   12/26/2016 11:51 AM Gertie FeyMichelle Damarea Merkel, BS, RVT, RDCS, RDMS

## 2016-12-26 NOTE — Progress Notes (Signed)
Occupational Therapy Treatment Patient Details Name: Trevor Perry MRN: [id number] DOB: [birth year] Today's Date: 12/26/2016    History of present illness 66 y.[facility]. [facility] diffuse basal SA[facility], EVC placed 12/21. PMHx: back sx   OT comments  Pt progressing towards established OT goals and presenting with increased problem solving and memory. Pt performing multi-step path finding task with visual aide and Min VCs. Pt requiring Min Guard-Min A for balance during functional mobility. Will continue to follow acutely. Continues to recommend dc home once medically stable per physician.    Follow Up Recommendations  No OT follow up;Supervision/Assistance - 24 hour    Equipment Recommendations  None recommended by OT    Recommendations for Other Services      Precautions / Restrictions Precautions Precautions: Fall;Other (comment) Precaution Comments: ventricular drain can be clamped 15min at a time Restrictions Weight Bearing Restrictions: No       Mobility Bed Mobility Overal bed mobility: Needs Assistance Bed Mobility: Rolling;Sidelying to Sit;Sit to Supine Rolling: Supervision Sidelying to sit: Supervision   Sit to supine: Supervision   General bed mobility comments: supervision for safety  Transfers Overall transfer level: Needs assistance Equipment used: None Transfers: Sit to/from Stand Sit to Stand: Min assist         General transfer comment: Min A for steadying in standing. Pt demosntrating decreased balance comparted to previous session. Lateral lean to L. Pt's wife reports he has been more lethargic today.    Balance Overall balance assessment: Needs assistance Sitting-balance support: No upper extremity supported;Feet supported Sitting balance-Leahy Scale: Good       Standing balance-Leahy Scale: Fair Standing balance comment: pt demonstrating decreased balance with lateral lean to L. Requiring Min A for safety  and correcting balance                           ADL either performed or assessed with clinical judgement   ADL Overall ADL's : Needs assistance/impaired                 Upper Body Dressing : Sitting;Minimal assistance Upper Body Dressing Details (indicate cue type and reason): don gown like jacket with Min A for line management                 Functional mobility during ADLs: Minimal assistance(Min A for lateral lean to L) General ADL Comments: Pt performing path finding task with map. Provided pt with instruction with three locations to find on unit and then had pt problem solve how to locate items on map. Pt then performed home level funcitonal mobility to ,locate three destinations on map. Pt requiring Min verbal cus for problem solvign and memory. Dmeonstrating increased cognition compared to prior session but continues to demonstrate poor cognition compare to baseline     Vision       Perception     Praxis      Cognition Arousal/Alertness: Awake/alert Behavior During Therapy: WFL for tasks assessed/performed Overall Cognitive Status: Impaired/Different from baseline Area of Impairment: Memory                     Memory: Decreased short-term memory         General Comments: Pt demonstrating increased problem solving and memory to complete path finding task with a map. Pt still not at cognitive baseline and requires Min VCs for task  Exercises     Shoulder Instructions       General Comments Wife present throughout session    Pertinent Vitals/ Pain       Pain Assessment: Faces Faces Pain Scale: Hurts little more Pain Location: headache Pain Descriptors / Indicators: Aching Pain Intervention(s): Monitored during session;Patient requesting pain meds-RN notified  Home Living                                          Prior Functioning/Environment              Frequency  Min 2X/week         Progress Toward Goals  OT Goals(current goals can now be found in the care plan section)  Progress towards OT goals: Progressing toward goals  Acute Rehab OT Goals Patient Stated Goal: "return to work next week or maybe later we will see" OT Goal Formulation: With patient Time For Goal Achievement: 12/29/16 Potential to Achieve Goals: Good ADL Goals Pt Will Perform Grooming: Independently;standing Pt Will Perform Upper Body Dressing: Independently;sitting;standing Pt Will Perform Lower Body Dressing: Independently;sit to/from stand Pt Will Transfer to Toilet: Independently;ambulating;regular height toilet Pt Will Perform Toileting - Clothing Manipulation and hygiene: Independently;sit to/from stand Additional ADL Goal #1: pt will be able to path find without vcs  Plan Discharge plan remains appropriate    Co-evaluation                 AM-PAC PT "6 Clicks" Daily Activity     Outcome Measure   Help from another person eating meals?: None Help from another person taking care of personal grooming?: A Little Help from another person toileting, which includes using toliet, bedpan, or urinal?: A Little Help from another person bathing (including washing, rinsing, drying)?: A Little Help from another person to put on and taking off regular upper body clothing?: A Little Help from another person to put on and taking off regular lower body clothing?: A Little 6 Click Score: 19    End of Session Equipment Utilized During Treatment: Gait belt  OT Visit Diagnosis: Unsteadiness on feet (R26.81)   Activity Tolerance Patient tolerated treatment well   Patient Left with call bell/phone within reach;in bed;with family/visitor present;with nursing/sitter in room   Nurse Communication Mobility status(therapy finished with pt so ventric drain could be unclamped)        Time: [id number] OT Time Calculation (min): 27 min  Charges: OT General Charges $OT Visit: 1 Visit OT  Treatments $Self Care/Home Management : 23-37 mins  Raissa Dam MSOT, OTR/L Acute Rehab Pager: [phone] Office: [phone]   Trevor Perry 12/26/2016, 4:58 PM

## 2016-12-26 NOTE — Progress Notes (Signed)
Subjective: Patient reports Patient doing well no headache complaining of a little bit of posterior thigh pain  Objective: Vital signs in last 24 hours: Temp:  [98.5 F (36.9 C)-100.5 F (38.1 C)] 98.5 F (36.9 C) (12/26 1200) Pulse Rate:  [61-95] 79 (12/26 1400) Resp:  [11-22] 19 (12/26 1400) BP: (131-168)/(62-88) 140/82 (12/26 1400) SpO2:  [89 %-98 %] 96 % (12/26 1400)  Intake/Output from previous day: 12/25 0701 - 12/26 0700 In: 2640 [P.O.:240; I.V.:2400] Out: 1162 [Urine:875; Drains:287] Intake/Output this shift: Total I/O In: 700 [I.V.:700] Out: 58 [Drains:58]  Awake alert oriented 4 strength 5 out of 5  Lab Results: No results for input(s): WBC, HGB, HCT, PLT in the last 72 hours. BMET No results for input(s): NA, K, CL, CO2, GLUCOSE, BUN, CREATININE, CALCIUM in the last 72 hours.  Studies/Results: No results found.  Assessment/Plan: Post-bleed day 5 subarachnoid hemorrhage continue EVD 1 more day we'll challenge raises during to 20. CT scan in the morning.  LOS: 6 days     Trevor Perry P 12/26/2016, 2:25 PM

## 2016-12-27 ENCOUNTER — Other Ambulatory Visit (HOSPITAL_COMMUNITY): Payer: Medicare HMO

## 2016-12-27 ENCOUNTER — Inpatient Hospital Stay (HOSPITAL_COMMUNITY): Payer: Medicare HMO

## 2016-12-27 NOTE — Progress Notes (Signed)
Subjective: Patient reports Patient is doing much better although is having a little bit of headache this point but feels overall no different from yesterday when he raised the drain.  Objective: Vital signs in last 24 hours: Temp:  [98.5 F (36.9 C)-98.7 F (37.1 C)] 98.6 F (37 C) (12/27 0400) Pulse Rate:  [51-83] 59 (12/27 0700) Resp:  [13-24] 17 (12/27 0700) BP: (118-157)/(50-91) 148/91 (12/27 0700) SpO2:  [90 %-98 %] 94 % (12/27 0700)  Intake/Output from previous day: 12/26 0701 - 12/27 0700 In: 2100 [I.V.:2100] Out: 835 [Urine:700; Drains:135] Intake/Output this shift: No intake/output data recorded.  Patient is awake alert oriented strength out of 5 no pronator drift  Lab Results: No results for input(s): WBC, HGB, HCT, PLT in the last 72 hours. BMET No results for input(s): NA, K, CL, CO2, GLUCOSE, BUN, CREATININE, CALCIUM in the last 72 hours.  Studies/Results: Ct Head Wo Contrast  Result Date: 12/27/2016 CLINICAL DATA:  66 y/o  M; follow-up of subarachnoid hemorrhage. EXAM: CT HEAD WITHOUT CONTRAST TECHNIQUE: Contiguous axial images were obtained from the base of the skull through the vertex without intravenous contrast. COMPARISON:  12/20/2016 CT head.  12/21/2016 cerebral angiogram. FINDINGS: Brain: Right frontal approach ventriculostomy catheter with tip in the cerebral aqueduct. Small area of hypoattenuation is present along course of the catheter in the right frontal lobe likely representing postprocedural edema. Interval decrease in ventricle size. Interval near complete dispersion of subarachnoid hemorrhage with trace residual in prepontine cistern. No large stroke or new hemorrhage identified. No mass effect. Vascular: No hyperdense vessel or unexpected calcification. Skull: Edema in the right frontal scalp from ventriculostomy placement. Small right frontal burr hole. Sinuses/Orbits: No acute finding. Other: None. IMPRESSION: 1. Decrease in ventricle size and near  complete dispersion of subarachnoid hemorrhage. 2. Right frontal approach ventriculostomy catheter with tip in the cerebral aqueduct and mild postprocedural edema along course of catheter in right frontal lobe. 3. No new hemorrhage or large stroke.  No significant mass effect. Electronically Signed   By: Mitzi HansenLance  Furusawa-Stratton M.D.   On: 12/27/2016 02:27    Assessment/Plan: Drain still put out 20 mL overnight despite being raised to 20 cm water. CT scan however shows decreased ventricular size. No clamp the drain keep her 1 more day repeated CT in the morning and go from there.  LOS: 7 days     Felicia Bloomquist P 12/27/2016, 7:50 AM

## 2016-12-28 ENCOUNTER — Inpatient Hospital Stay (HOSPITAL_COMMUNITY): Payer: Medicare HMO

## 2016-12-28 DIAGNOSIS — I609 Nontraumatic subarachnoid hemorrhage, unspecified: Secondary | ICD-10-CM

## 2016-12-28 NOTE — Progress Notes (Signed)
Transcranial Doppler  Date POD PCO2 HCT BP  MCA ACA PCA OPHT SIPH VERT Basilar  12/21 CB     Right  Left   38  46   -40  -29   -21     23  25    48  32   -29  -15   *      12/24 JE     Right  Left   55  72   -62  -60   -46  12   19  24    66  40   *  -23   *      12/26/16 MS     Right  Left   38  65   -19  -24   *  10   22  22    66  36   -11  -11   *      12/28/16 MS      Right  Left   38  70   -67  -19   *  32   22  22   26  18    -16  -19   *            Right  Left                                            Right  Left                                            Right  Left                                        MCA = Middle Cerebral Artery      OPHT = Opthalmic Artery     BASILAR = Basilar Artery   ACA = Anterior Cerebral Artery     SIPH = Carotid Siphon PCA = Posterior Cerebral Artery   VERT = Verterbral Artery                   Normal MCA = 62+\-12 ACA = 50+\-12 PCA = 42+\-23   12/21/16- Unable to insonate basilar, right distal ICA, left terminal ICA or left PCA  due to patient agitation. -CB/RS  12/26/16- *Unable to insonate. Right Lindegaard ratio=1.19, left Lindegaard ratio=1.91. MS  12/28/16- *Unable to insonate. Right Lindegaard ratio=1.41, left Lindegaard ratio=2.41. MS  12/28/2016 11:27 AM Trevor FeyMichelle Keirstyn Perry, BS, RVT, RDCS, RDMS

## 2016-12-28 NOTE — Progress Notes (Signed)
Subjective: Patient reports Patient doing well improved headache from yesterday no nausea  Objective: Vital signs in last 24 hours: Temp:  [96.3 F (35.7 C)-98.5 F (36.9 C)] 97.9 F (36.6 C) (12/28 1200) Pulse Rate:  [51-93] 62 (12/28 1100) Resp:  [9-23] 15 (12/28 1100) BP: (127-166)/(57-94) 142/72 (12/28 1100) SpO2:  [84 %-98 %] 95 % (12/28 1100)  Intake/Output from previous day: 12/27 0701 - 12/28 0700 In: -  Out: 3 [Drains:3] Intake/Output this shift: No intake/output data recorded.  Awake alert oriented strength out of 5 wound clean dry and intact  Lab Results: No results for input(s): WBC, HGB, HCT, PLT in the last 72 hours. BMET No results for input(s): NA, K, CL, CO2, GLUCOSE, BUN, CREATININE, CALCIUM in the last 72 hours.  Studies/Results: Ct Head Wo Contrast  Result Date: 12/28/2016 CLINICAL DATA:  66 y/o  M; subarachnoid hemorrhage for follow-up. EXAM: CT HEAD WITHOUT CONTRAST TECHNIQUE: Contiguous axial images were obtained from the base of the skull through the vertex without intravenous contrast. COMPARISON:  12/27/2016 CT of the head. FINDINGS: Brain: Stable right frontal approach ventriculostomy catheter with tip in the cerebral aqueduct. Stable small area of hypoattenuation along the course of the catheter likely representing postprocedural edema. Stable ventricle size. Stable minimal subarachnoid hemorrhage in prepontine cistern. No new acute intracranial hemorrhage, large acute infarct, or focal mass effect. Vascular: No hyperdense vessel or unexpected calcification. Skull: Stable postsurgical changes related to right frontal burr hole. Sinuses/Orbits: Mild diffuse paranasal sinus mucosal thickening. Normal aeration of mastoid air cells. Orbits are unremarkable. Other: None. IMPRESSION: Stable minimal subarachnoid hemorrhage in the pontine cistern. Stable ventricle size and position of ventriculostomy catheter. No new acute intracranial abnormality. Electronically  Signed   By: Mitzi HansenLance  Furusawa-Stratton M.D.   On: 12/28/2016 01:54   Ct Head Wo Contrast  Result Date: 12/27/2016 CLINICAL DATA:  66 y/o  M; follow-up of subarachnoid hemorrhage. EXAM: CT HEAD WITHOUT CONTRAST TECHNIQUE: Contiguous axial images were obtained from the base of the skull through the vertex without intravenous contrast. COMPARISON:  12/20/2016 CT head.  12/21/2016 cerebral angiogram. FINDINGS: Brain: Right frontal approach ventriculostomy catheter with tip in the cerebral aqueduct. Small area of hypoattenuation is present along course of the catheter in the right frontal lobe likely representing postprocedural edema. Interval decrease in ventricle size. Interval near complete dispersion of subarachnoid hemorrhage with trace residual in prepontine cistern. No large stroke or new hemorrhage identified. No mass effect. Vascular: No hyperdense vessel or unexpected calcification. Skull: Edema in the right frontal scalp from ventriculostomy placement. Small right frontal burr hole. Sinuses/Orbits: No acute finding. Other: None. IMPRESSION: 1. Decrease in ventricle size and near complete dispersion of subarachnoid hemorrhage. 2. Right frontal approach ventriculostomy catheter with tip in the cerebral aqueduct and mild postprocedural edema along course of catheter in right frontal lobe. 3. No new hemorrhage or large stroke.  No significant mass effect. Electronically Signed   By: Mitzi HansenLance  Furusawa-Stratton M.D.   On: 12/27/2016 02:27    Assessment/Plan: Drain was clamped of last 24 hours repeat CT scan shows improved ventricular size and patient clinically remains stable so I discontinued the external ventricular drain. Continue to observe in the ICU will consider transfer to the floor tomorrow.  LOS: 8 days     Lilu Mcglown P 12/28/2016, 1:45 PM

## 2016-12-28 NOTE — Progress Notes (Signed)
Physical Therapy Treatment Patient Details Name: Trevor Perry MRN: [id number] DOB: May 28, 1950 Today's Date: 12/28/2016    History of Present Illness 66 y.o. male [facility] diffuse basal SAH, EVC placed 12/21. PMHx: back sx    PT Comments    Pt progressing well.  Pt a little flat of affect, looks a little groggy, but follows direction and stays on task well.  Emphasis on bed mobility, transitions and gait stability with balance challenges including scanning, changes in speed, abrupt changes in direction.   Follow Up Recommendations  No PT follow up     Equipment Recommendations  None recommended by PT    Recommendations for Other Services       Precautions / Restrictions Precautions Precautions: Fall;Other (comment)    Mobility  Bed Mobility Overal bed mobility: Needs Assistance Bed Mobility: Rolling;Sidelying to Sit Rolling: Supervision Sidelying to sit: Supervision       General bed mobility comments: supervision for safety  Transfers Overall transfer level: Needs assistance Equipment used: None Transfers: Sit to/from Stand Sit to Stand: Min guard         General transfer comment: min guard for safety  Ambulation/Gait Ambulation/Gait assistance: Supervision Ambulation Distance (Feet): 500 Feet Assistive device: None Gait Pattern/deviations: Step-through pattern   Gait velocity interpretation: Below normal speed for age/gender General Gait Details: Some unsteadiness noted when pt scanning, changing direction or walking very slowly.  Pt was able to vary speed, scan, turn or change direction to cueing without significant deviation, but with some listing left.   Stairs Stairs: Yes   Stair Management: One rail Right;Alternating pattern;Step to pattern;Forwards Number of Stairs: 12 General stair comments: slow and guarded, needed the rail.  min guard necessary for safety.  Wheelchair Mobility    Modified Rankin  (Stroke Patients Only) Modified Rankin (Stroke Patients Only) Pre-Morbid Rankin Score: No symptoms     Balance Overall balance assessment: Needs assistance Sitting-balance support: No upper extremity supported Sitting balance-Leahy Scale: Good       Standing balance-Leahy Scale: Fair                              Cognition Arousal/Alertness: Awake/alert Behavior During Therapy: WFL for tasks assessed/performed Overall Cognitive Status: Within Functional Limits for tasks assessed(not formally assessed)                                        Exercises      General Comments        Pertinent Vitals/Pain Pain Assessment: Faces Faces Pain Scale: Hurts little more Pain Location: back of legs R>L, HA Pain Descriptors / Indicators: Aching;Sore Pain Intervention(s): Monitored during session    Home Living                      Prior Function            PT Goals (current goals can now be found in the care plan section) Acute Rehab PT Goals Patient Stated Goal: "return to work next week or maybe later we will see" PT Goal Formulation: With patient Time For Goal Achievement: 01/05/17 Potential to Achieve Goals: Good Progress towards PT goals: Progressing toward goals    Frequency    Min 3X/week      PT Plan Current plan remains appropriate  Co-evaluation              AM-PAC PT "6 Clicks" Daily Activity  Outcome Measure  Difficulty turning over in bed (including adjusting bedclothes, sheets and blankets)?: None Difficulty moving from lying on back to sitting on the side of the bed? : None Difficulty sitting down on and standing up from a chair with arms (e.g., wheelchair, bedside commode, etc,.)?: A Little Help needed moving to and from a bed to chair (including a wheelchair)?: A Little Help needed walking in hospital room?: A Little Help needed climbing 3-5 steps with a railing? : A Little 6 Click Score: 20    End  of Session   Activity Tolerance: Patient tolerated treatment well Patient left: in chair;with call bell/phone within reach;with family/visitor present Nurse Communication: Mobility status PT Visit Diagnosis: Other abnormalities of gait and mobility (R26.89)     Time: 1610-96041508-1535 PT Time Calculation (min) (ACUTE ONLY): 27 min  Charges:  $Gait Training: 8-22 mins $Therapeutic Activity: 8-22 mins                    G Codes:       12/28/2016  Poplar BingKen Kynnadi Dicenso, PT [phone]  (pager)   Trevor Perry 12/28/2016, 4:14 PM

## 2016-12-29 MED ORDER — SENNA 8.6 MG PO TABS
1.0000 | ORAL_TABLET | Freq: Every day | ORAL | Status: DC
  Administered 2016-12-29 – 2017-01-03 (×3): 8.6 mg via ORAL
  Filled 2016-12-29 (×4): qty 1

## 2016-12-29 MED ORDER — DEXAMETHASONE SODIUM PHOSPHATE 10 MG/ML IJ SOLN
10.0000 mg | Freq: Once | INTRAMUSCULAR | Status: AC
  Administered 2016-12-29: 10 mg via INTRAVENOUS
  Filled 2016-12-29: qty 1

## 2016-12-29 MED ORDER — DEXAMETHASONE SODIUM PHOSPHATE 4 MG/ML IJ SOLN
4.0000 mg | Freq: Four times a day (QID) | INTRAMUSCULAR | Status: DC
  Administered 2016-12-29 – 2016-12-31 (×9): 4 mg via INTRAVENOUS
  Filled 2016-12-29 (×9): qty 1

## 2016-12-29 NOTE — Progress Notes (Signed)
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

## 2016-12-30 MED ORDER — BISACODYL 10 MG RE SUPP: 10.0000 mg | Freq: Every day | RECTAL | Status: DC | PRN

## 2016-12-30 NOTE — Progress Notes (Signed)
Subjective: The patient is alert and pleasant.  His wife is at the bedside.  He has no complaints.  He looks well.  Objective: Vital signs in last 24 hours: Temp:  [97.3 F (36.3 C)-98.8 F (37.1 C)] 97.7 F (36.5 C) (12/30 0800) Pulse Rate:  [48-176] 73 (12/30 0800) Resp:  [11-25] 16 (12/30 0800) BP: (117-154)/(54-92) 136/91 (12/30 0800) SpO2:  [91 %-100 %] 95 % (12/30 0800)  Intake/Output from previous day: 12/29 0701 - 12/30 0700 In: 810 [P.O.:810] Out: -  Intake/Output this shift: No intake/output data recorded.  Physical exam the patient is alert and oriented.  He is moving all 4 extremities well.  Lab Results: No results for input(s): WBC, HGB, HCT, PLT in the last 72 hours. BMET No results for input(s): NA, K, CL, CO2, GLUCOSE, BUN, CREATININE, CALCIUM in the last 72 hours.  Studies/Results: No results found.  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  He is okay for transfer to the floor.  LOS: 10 days     Cristi LoronJeffrey D Elonna Mcfarlane 12/30/2016, 10:30 AM      Patient ID: Trevor Perry, male   DOB: 11/26/1950, 66 y.o.   MRN: [id number]

## 2016-12-31 MED ORDER — DEXAMETHASONE 2 MG PO TABS
2.0000 mg | ORAL_TABLET | Freq: Two times a day (BID) | ORAL | Status: DC
  Administered 2016-12-31 – 2017-01-03 (×6): 2 mg via ORAL
  Filled 2016-12-31 (×6): qty 1

## 2016-12-31 NOTE — Progress Notes (Signed)
Subjective: Patient reports Patient is doing great no headache no nausea or vomiting  Objective: Vital signs in last 24 hours: Temp:  [97.5 F (36.4 C)-98.2 F (36.8 C)] 97.6 F (36.4 C) (12/31 1200) Pulse Rate:  [55-87] 73 (12/31 1200) Resp:  [17-22] 17 (12/31 1200) BP: (125-140)/(76-89) 131/81 (12/31 1200) SpO2:  [96 %-100 %] 96 % (12/31 1200)  Intake/Output from previous day: No intake/output data recorded. Intake/Output this shift: Total I/O In: 400 [Perry.O.:400] Out: -   Awake alert oriented strength out of 5  Lab Results: No results for input(s): WBC, HGB, HCT, PLT in the last 72 hours. BMET No results for input(s): NA, K, CL, CO2, GLUCOSE, BUN, CREATININE, CALCIUM in the last 72 hours.  Studies/Results: No results found.  Assessment/Plan: Continue observation until Dr. Conchita ParisNundkumar returns for repeat angiography.  LOS: 11 days     Trevor Perry 12/31/2016, 3:49 PM

## 2016-12-31 NOTE — Progress Notes (Signed)
Occupational Therapy Treatment Patient Details Name: Trevor ApplebaumRoy Perry MRN: [id number] DOB: 15-Jun-1950 Today's Date: 12/31/2016    History of present illness 66 y.o. male [facility] diffuse basal SA[facility], EVC placed 12/21 and D/C 12/28. PMHx: back sx   OT comments  Pt progressing towards established goals. Educated pt and wife on external memory aides to increase independence and success with ADLs and IADLs. Perform trail making task with external visual aide (i.e. list making) and Min VCs for problem solving. Pt demonstrating increased cognition and activity tolerance. Continue to recommend dc home once medically stable. Will continue to follow acutely to facilitate safe dc.   Follow Up Recommendations  No OT follow up;Supervision/Assistance - 24 hour    Equipment Recommendations  None recommended by OT    Recommendations for Other Services      Precautions / Restrictions Precautions Precautions: Fall Restrictions Weight Bearing Restrictions: No       Mobility Bed Mobility               General bed mobility comments: Pt in recliner upon arrival  Transfers Overall transfer level: Needs assistance Equipment used: None Transfers: Sit to/from Stand Sit to Stand: Supervision         General transfer comment: supervision for safety    Balance Overall balance assessment: Needs assistance Sitting-balance support: No upper extremity supported Sitting balance-Leahy Scale: Good       Standing balance-Leahy Scale: Good                             ADL either performed or assessed with clinical judgement   ADL Overall ADL's : Needs assistance/impaired             Lower Body Bathing: Supervison/ safety;Sit to/from stand Lower Body Bathing Details (indicate cue type and reason): Pt performed LB bathing at sink to clean up after BM Upper Body Dressing : Supervision/safety;Sitting;With caregiver independent  assisting Upper Body Dressing Details (indicate cue type and reason): don/dof gown like jacket   Lower Body Dressing Details (indicate cue type and reason): Educated pt on compensatory techniques so he does not have to bend forward and put pressure on head wound Toilet Transfer: Ambulation;Regular Network engineerToilet;Supervision/safety Toilet Transfer Details (indicate cue type and reason): supervision for safety Toileting- Clothing Manipulation and Hygiene: Sitting/lateral lean;Supervision/safety;Sit to/from stand       Functional mobility during ADLs: Min guard(Initial ong hand held A due to pt feeling off balance) General ADL Comments: Pt performing three part trail making. Pt requiring Min VCs for completing task and used visual aide (making a list) for recall of locations. Pt performing toileting with supervision.     Vision       Perception     Praxis      Cognition Arousal/Alertness: Awake/alert Behavior During Therapy: WFL for tasks assessed/performed Overall Cognitive Status: Impaired/Different from baseline Area of Impairment: Memory;Problem solving                     Memory: Decreased short-term memory       Problem Solving: Slow processing;Requires verbal cues General Comments: Pt demonstrating increased cognition and memory. Educated pt on external aides (i.e. lists, alarms, calendars) to increased independence with IADLs at home. Pt performing trail making task with MIn VCs for problem solving and use of a list to recall the three locations to find.         Exercises  Shoulder Instructions       General Comments Wife present throughout session    Pertinent Vitals/ Pain       Pain Assessment: Faces Faces Pain Scale: Hurts a little bit Pain Location: HA Pain Descriptors / Indicators: Aching Pain Intervention(s): Monitored during session  Home Living                                          Prior Functioning/Environment               Frequency  Min 2X/week        Progress Toward Goals  OT Goals(current goals can now be found in the care plan section)  Progress towards OT goals: Progressing toward goals  Acute Rehab OT Goals Patient Stated Goal: "return to work next week or maybe later we will see" OT Goal Formulation: With patient Time For Goal Achievement: 12/29/16 Potential to Achieve Goals: Good ADL Goals Pt Will Perform Grooming: Independently;standing Pt Will Perform Upper Body Dressing: Independently;sitting;standing Pt Will Perform Lower Body Dressing: Independently;sit to/from stand Pt Will Transfer to Toilet: Independently;ambulating;regular height toilet Pt Will Perform Toileting - Clothing Manipulation and hygiene: Independently;sit to/from stand Additional ADL Goal #1: pt will be able to path find without vcs  Plan Discharge plan remains appropriate    Co-evaluation                 AM-PAC PT "6 Clicks" Daily Activity     Outcome Measure   Help from another person eating meals?: None Help from another person taking care of personal grooming?: None Help from another person toileting, which includes using toliet, bedpan, or urinal?: A Little Help from another person bathing (including washing, rinsing, drying)?: A Little Help from another person to put on and taking off regular upper body clothing?: None Help from another person to put on and taking off regular lower body clothing?: A Little 6 Click Score: 21    End of Session Equipment Utilized During Treatment: Gait belt  OT Visit Diagnosis: Unsteadiness on feet (R26.81)   Activity Tolerance Patient tolerated treatment well   Patient Left with call bell/phone within reach;with family/visitor present;in chair   Nurse Communication Mobility status        Time: [id number] OT Time Calculation (min): 37 min  Charges: OT General Charges $OT Visit: 1 Visit OT Treatments $Self Care/Home Management : 23-37 mins  Trevor Perry MSOT, OTR/L Acute Rehab Pager: [phone] Office: [phone]   Trevor GristCharis M [name] 12/31/2016, 3:23 PM

## 2016-12-31 NOTE — Care Management Note (Signed)
Case Management Note  Patient Details  Name: Trevor ApplebaumRoy Flaum MRN: [id number] Date of Birth: 1951-01-01  Subjective/Objective:   Pt admitted on 12/20/16 with SAH.  PTA, pt independent, lives with spouse.                   Action/Plan: Pt currently remains on [place].  Will follow for discharge needs as pt progresses.    Expected Discharge Date:                  Expected Discharge Plan:  OP Rehab  In-House Referral:     Discharge planning Services  CM Consult  Post Acute Care Choice:    Choice offered to:     DME Arranged:    DME Agency:     HH Arranged:    HH Agency:     Status of Service:  In process, will continue to follow  If discussed at Long Length of Stay Meetings, dates discussed:    Additional Comments:  Glennon Macmerson, Rieley Hausman M, RN 12/31/2016, 4:51 PM

## 2016-12-31 NOTE — Progress Notes (Signed)
  Speech Language Pathology Treatment: Cognitive-Linquistic  Patient Details Name: Trevor Perry MRN: [id number] DOB: 05-18-1950 Today's Date: 12/31/2016 Time: 5784-69621400-1421 SLP Time Calculation (min) (ACUTE ONLY): 21 min  Assessment / Plan / Recommendation Clinical Impression  SLP followed up for cognitive intervention. Pt alert, upright in chair at bedside with spouse present. Spouse and pt report improvements in thinking skills since admission to hospital however short term memory deficits and higher level thinking skill deficits persist. SLP educated regarding compensatory memory strategies and recommended graphic expression memory log. Pt and spouse agreeable and eager for implementation of strategies. SLP reviewed use of visual aids at bedside for improved safety awareness, pts insight appears to be improving, stating "I do not want to get up by myself and risk injury". SLP to continue to follow for cognitive intervention.    HPI HPI: 66 y.o.maleadmitted with subarachnoid hemorrhage and progressive lethargy with serial CT demonstrating progressive ventriculomegaly indicating hydrocephalus. EVD 12/21.      SLP Plan  Continue with current plan of care       Recommendations   24 hour care at home, spouse available, continued acute ST intervention: HH vs outpatient ST                 SLP Visit Diagnosis: Cognitive communication deficit (X52.841(R41.841) Plan: Continue with current plan of care       GO               Jeani Sowhelsea Kyleigh Nannini MA, CCC-SLP Acute Care Speech Language Pathologist    Trevor Perry Trevor Perry 12/31/2016, 2:26 PM

## 2016-12-31 NOTE — Progress Notes (Signed)
Physical Therapy Treatment Patient Details Name: Trevor Perry MRN: [id number] DOB: 1950/05/22 Today's Date: 12/31/2016    History of Present Illness 66 [facility]. [facility] diffuse basal SA[facility], EVC placed 12/21 and D/C 12/28. PMHx: back sx    PT Comments    Pt very pleasant and reports having not slept last night. Pt with increased ability to recall room number, recall events since admission and use numbers for reorientation to room. Pt with increased activity tolerance and reports muscle fatigue from gait but not overall exhaustion with activity. Pt educated for seated marching and LAQ as well as encouraged daily ambulation with nursing or family. Pt with higher level balance deficits still present and OPPT would be beneficial. Will follow acutely.    Follow Up Recommendations  Outpatient PT     Equipment Recommendations  None recommended by PT    Recommendations for Other Services       Precautions / Restrictions Precautions Precautions: Fall Restrictions Weight Bearing Restrictions: No    Mobility  Bed Mobility Overal bed mobility: Modified Independent                Transfers Overall transfer level: Needs assistance   Transfers: Sit to/from Stand Sit to Stand: Supervision         General transfer comment: supervision for safety  Ambulation/Gait Ambulation/Gait assistance: Supervision Ambulation Distance (Feet): 700 Feet Assistive device: None Gait Pattern/deviations: Step-through pattern;Decreased stride length   Gait velocity interpretation: at or above normal speed for age/gender General Gait Details: pt able to scan environment, perform head turns and change of speed and direction without LOB. Pt uanable to achieve very fast gait   Stairs            Wheelchair Mobility    Modified Rankin (Stroke Patients Only) Modified Rankin (Stroke Patients Only) Pre-Morbid Rankin Score: No symptoms Modified  Rankin: No significant disability     Balance Overall balance assessment: Needs assistance   Sitting balance-Leahy Scale: Good       Standing balance-Leahy Scale: Good Standing balance comment: pt able to perform 360 degree turn bil in <4 sec Single Leg Stance - Right Leg: 2 Single Leg Stance - Left Leg: 6       Rhomberg - Eyes Closed: 15                Cognition Arousal/Alertness: Awake/alert Behavior During Therapy: WFL for tasks assessed/performed Overall Cognitive Status: Impaired/Different from baseline Area of Impairment: Memory                               General Comments: pt unable to recall room number initially but able to recall throughout session and use numbers to locate room without assist      Exercises      General Comments        Pertinent Vitals/Pain Pain Score: 2  Pain Location: HA Pain Descriptors / Indicators: Aching Pain Intervention(s): Limited activity within patient's tolerance    Home Living                      Prior Function            PT Goals (current goals can now be found in the care plan section) Progress towards PT goals: Progressing toward goals    Frequency           PT Plan Discharge  plan needs to be updated    Co-evaluation              AM-PAC PT "6 Clicks" Daily Activity  Outcome Measure  Difficulty turning over in bed (including adjusting bedclothes, sheets and blankets)?: None Difficulty moving from lying on back to sitting on the side of the bed? : None Difficulty sitting down on and standing up from a chair with arms (e.g., wheelchair, bedside commode, etc,.)?: None Help needed moving to and from a bed to chair (including a wheelchair)?: A Little Help needed walking in hospital room?: A Little Help needed climbing 3-5 steps with a railing? : A Little 6 Click Score: 21    End of Session Equipment Utilized During Treatment: Gait belt Activity Tolerance: Patient  tolerated treatment well Patient left: in chair;with call bell/phone within reach;with family/visitor present Nurse Communication: Mobility status PT Visit Diagnosis: Other abnormalities of gait and mobility (R26.89)     Time: [id number] PT Time Calculation (min) (ACUTE ONLY): 25 min  Charges:  $Gait Training: 8-22 mins                    G Codes:       Trevor Perry, PT [phone]    Trevor Perry 12/31/2016, 10:23 AM

## 2017-01-01 ENCOUNTER — Inpatient Hospital Stay (HOSPITAL_COMMUNITY): Payer: Medicare HMO

## 2017-01-01 MED ORDER — SODIUM CHLORIDE 0.9 % IV SOLN
500.0000 mg | INTRAVENOUS | Status: AC
  Administered 2017-01-01: 500 mg via INTRAVENOUS
  Filled 2017-01-01: qty 5

## 2017-01-01 NOTE — Progress Notes (Signed)
Pt seen and examined. Fell and hit left side of forehead.  Complains of tailbone pain.  EXAM: Temp:  [97.2 F (36.2 C)-98.1 F (36.7 C)] 97.6 F (36.4 C) (01/01 0834) Pulse Rate:  [66-92] 69 (01/01 0800) Resp:  [16-23] 18 (01/01 0800) BP: (114-160)/(55-98) 129/81 (01/01 0800) SpO2:  [93 %-100 %] 99 % (01/01 0800) Intake/Output      12/31 0701 - 01/01 0700 01/01 0701 - 01/02 0700   P.O. 400    Total Intake(mL/kg) 400 (3.9)    Net +400         Urine Occurrence 7 x    Stool Occurrence 1 x     Awake, alert, oriented Moving all extremities well, no drift.  LABS: Lab Results  Component Value Date   CREATININE 0.99 12/20/2016   BUN 20 12/20/2016   NA 137 12/20/2016   K 3.4 (L) 12/20/2016   CL 102 12/20/2016   CO2 25 12/20/2016   Lab Results  Component Value Date   WBC 5.7 12/20/2016   HGB 14.6 12/20/2016   HCT 42.3 12/20/2016   MCV 88.9 12/20/2016   PLT 196 12/20/2016    IMAGING: CT head independently reviewed.  No traumatic injury.  Clearance of SAH.  IMPRESSION: - 67 y.o. male with angio negative SAH.  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  PLAN: - Continue observation - Continue steroids - Dispo pending repeat arteriogram

## 2017-01-01 NOTE — Progress Notes (Signed)
Patient ID: Trevor Perry, male   DOB: 22-Sep-1950, 67 y.o.   MRN: [id number] BP 129/81 (BP Location: Right Arm)   Pulse 69   Temp 97.6 F (36.4 C) (Oral)   Resp 18   Ht 6' (1.829 m)   Wt 102.6 kg (226 lb 3.1 oz)   SpO2 99%   BMI 30.68 kg/m  Patient is currently alert, oriented x 4, speech is clear and fluent Perrl, full eom Symmetric facial movements and sensation Moving all extremities well, no drift.  Called this morning after Trevor Perry fell off the toilet seat. He does not know if he blacked out, was tired, just that he was falling and couldn't stop himself from going to ground. According to the nurses he was not responsive, eyes had rolled up in his head. It was their impression that he had and was seizing. A head ct was ordered, and it was normal. He is back at his baseline. I gave him a 500cc bolus of Keppra this am.

## 2017-01-02 ENCOUNTER — Inpatient Hospital Stay (HOSPITAL_COMMUNITY): Payer: Medicare HMO

## 2017-01-02 ENCOUNTER — Other Ambulatory Visit (HOSPITAL_COMMUNITY): Payer: Medicare HMO

## 2017-01-02 MED ORDER — FENTANYL CITRATE (PF) 100 MCG/2ML IJ SOLN: 100.0000 ug | INTRAMUSCULAR | Status: DC | PRN

## 2017-01-02 NOTE — Progress Notes (Signed)
Subjective: Patient reports Patient doing well complaining of tailbone pain but otherwise no complaints  Objective: Vital signs in last 24 hours: Temp:  [97.5 F (36.4 C)-98.9 F (37.2 C)] 98.9 F (37.2 C) (01/02 1600) Pulse Rate:  [66-81] 81 (01/02 1500) Resp:  [12-36] 17 (01/02 1500) BP: (105-163)/(60-95) 159/70 (01/02 1500) SpO2:  [92 %-100 %] 99 % (01/02 1500)  Intake/Output from previous day: No intake/output data recorded. Intake/Output this shift: No intake/output data recorded.  Patient is awake alert oriented strength 5 out of 5 no pronator drift  Lab Results: No results for input(s): WBC, HGB, HCT, PLT in the last 72 hours. BMET No results for input(s): NA, K, CL, CO2, GLUCOSE, BUN, CREATININE, CALCIUM in the last 72 hours.  Studies/Results: Ct Head Wo Contrast  Result Date: 01/01/2017 CLINICAL DATA:  Fall today.  Stroke followup EXAM: CT HEAD WITHOUT CONTRAST TECHNIQUE: Contiguous axial images were obtained from the base of the skull through the vertex without intravenous contrast. COMPARISON:  CT head 12/28/2016 FINDINGS: Brain: Right frontal ventricular drain has been removed in the interval. Small hypodensity in the right frontal lobe related to catheter placement. No hemorrhage. Ventricle size normal. Hypodensity in the frontal lobes bilaterally is unchanged and likely related to chronic ischemia. No acute infarct or mass Vascular: Negative for hyperdense vessel Skull: Negative Sinuses/Orbits: Mild sinus mucosal edema Other: None IMPRESSION: Interval removal of right frontal ventricular catheter. No acute hemorrhage or infarct. Negative for hydrocephalus. Electronically Signed   By: Marlan Palauharles  Clark M.D.   On: 01/01/2017 07:30   Ct Pelvis Wo Contrast  Result Date: 01/02/2017 CLINICAL DATA:  Progressive sacral and SI joint pain with bilateral sciatica. History of spondyloarthropathy. Previous back surgery. EXAM: CT PELVIS WITHOUT CONTRAST TECHNIQUE: Multidetector CT imaging  of the pelvis was performed following the standard protocol without intravenous contrast. COMPARISON:  None. FINDINGS: Urinary Tract: The bladder is mildly distended. The visualized ureters appear unremarkable. No evidence of urinary tract calculus. Bowel: No bowel wall thickening, distention or surrounding inflammation identified within the pelvis. The appendix appears normal. Vascular/Lymphatic: No enlarged pelvic lymph nodes identified. Mild aortoiliac atherosclerosis. Reproductive: The prostate gland and seminal vesicles appear unremarkable. Other: The visualized anterior abdominal wall appears normal. There is no ascites. Musculoskeletal: The sacrum is intact. There are mild sacroiliac degenerative changes without evidence of sacroiliitis. There is mild osteitis pubis, asymmetric to the left. The remainder of the bony pelvis appears normal. The hip joint spaces are maintained. Advanced spondylosis is present within the visualized lower lumbar spine. There is suspected significant spinal stenosis at L3-4, incompletely visualized. There are postsurgical changes at L4-5 with incomplete posterior decompression of the spinal canal and asymmetric left-sided facet arthropathy. 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 IMPRESSION: 1. Mild sacroiliac degenerative changes bilaterally and mild osteitis pubis. No evidence of sacroiliitis. 2. No acute osseous findings. 3. Significant lower lumbar spondylosis, incompletely evaluated by this examination. These findings could contribute to the patient's symptoms, and further evaluation with dedicated imaging of the lumbar spine should be considered. 4. Mild aortoiliac atherosclerosis. Electronically Signed   By: Carey BullocksWilliam  Veazey M.D.   On: 01/02/2017 13:45    Assessment/Plan: 67 year old gentleman says a subarachnoid hemorrhage is awaiting follow-up angiography per Dr. Conchita ParisNundkumar should return tomorrow. CT of his pelvis was negative for  fracture  LOS: 13 days     Yisel Megill P 01/02/2017, 4:28 PM

## 2017-01-02 NOTE — Progress Notes (Signed)
Vascular lab called inquiring if the TCDs need to be continued, spoke with Dr. Wynetta Emeryram who stated that we do not need to complete them.  Vascular lab notified and stated they would cancel them.

## 2017-01-02 NOTE — Progress Notes (Signed)
Pt continues to complain of pain in his buttock without relief from medication, heat, and repositioning. Dr Cabbell awFranky Machoare, new orders given and carried out. Will continue to monitor. Dicie BeamFrazier, Trevor Akram RN BSN.

## 2017-01-02 NOTE — Progress Notes (Signed)
Occupational Therapy Treatment Patient Details Name: Trevor Perry MRN: [id number] DOB: 1950-11-27 Today's Date: 01/02/2017    History of present illness 67 y.o. male presenting with sudden onset [facility] with CT demonstrating diffuse basal SAH, EVC placed 12/21 and D/C 12/28. PMHx: back sx   OT comments  Pt demonstrating decreased cognition and activity tolerance compared with previous sessions. Pt required Mod-Max A for problem solving and memory during trail making task. Educated family on use of 3N1 over toilet and as shower seat to increase safety and activity tolerance at home; wife verbalized understanding. Will continue to follow acutely to facilitate safe dc.    Follow Up Recommendations  No OT follow up;Supervision/Assistance - 24 hour (May benefit from OP OT if cognitive deficits continue)   Equipment Recommendations  None recommended by OT    Recommendations for Other Services      Precautions / Restrictions Precautions Precautions: Fall Restrictions Weight Bearing Restrictions: No       Mobility Bed Mobility Overal bed mobility: Modified Independent Bed Mobility: Rolling;Sidelying to Sit;Sit to Supine Rolling: Supervision Sidelying to sit: Supervision   Sit to supine: Supervision   General bed mobility comments: supervision for safety  Transfers Overall transfer level: Needs assistance Equipment used: Rolling walker (2 wheeled) Transfers: Sit to/from Stand Sit to Stand: Supervision         General transfer comment: supervision for safety    Balance Overall balance assessment: Needs assistance Sitting-balance support: No upper extremity supported Sitting balance-Leahy Scale: Good     Standing balance support: Bilateral upper extremity supported;During functional activity;No upper extremity supported Standing balance-Leahy Scale: Fair Standing balance comment: Able to maintain static standing without UE support                           ADL  either performed or assessed with clinical judgement   ADL Overall ADL's : Needs assistance/impaired                 Upper Body Dressing : Supervision/safety;Sitting;With caregiver independent assisting Upper Body Dressing Details (indicate cue type and reason): don/dof gown like jacket Lower Body Dressing: Sit to/from stand;Supervision/safety Lower Body Dressing Details (indicate cue type and reason): Adjusting socks by brining ankle to knees   Toilet Transfer Details (indicate cue type and reason): Educated wife on need for [facility] over toilet at home to prevent falls and decrease fatigue.        Tub/Shower Transfer Details (indicate cue type and reason): Educated pt on need for [facility] or shower seat in shower at home. Discussed use of BSC for safety and poor activity tolerance.  Functional mobility during ADLs: Min guard;Rolling walker General ADL Comments: Pt demonstrating decreased funcitonal performance compared to prior session. Pt with poor problem solving and memory and required mod-Max cues for trail making task. Pt with fall from toilet yesterday (01/01/17). Discussed with wife use of BSC for toilet seat and shower seat. Wife verbalizing understanding.      Vision       Perception     Praxis      Cognition Arousal/Alertness: Awake/alert Behavior During Therapy: WFL for tasks assessed/performed Overall Cognitive Status: Impaired/Different from baseline Area of Impairment: Memory;Problem solving                     Memory: Decreased short-term memory       Problem Solving: Slow processing;Requires verbal cues General Comments: Pt presenting with decreased problem solving and  memory compared to prior session. Pt requiring Mod-Max cues for trail making task        Exercises     Shoulder Instructions       General Comments Wife present throughout session    Pertinent Vitals/ Pain       Pain Assessment: Faces Faces Pain Scale: Hurts even more Pain  Location: headache and RLE Pain Descriptors / Indicators: Aching Pain Intervention(s): Monitored during session;Limited activity within patient's tolerance;Repositioned  Home Living                                          Prior Functioning/Environment              Frequency  Min 2X/week        Progress Toward Goals  OT Goals(current goals can now be found in the care plan section)  Progress towards OT goals: Not progressing toward goals - comment(Pt with decreased cognition compared to prior sessions)  Acute Rehab OT Goals Patient Stated Goal: "return to work next week or maybe later we will see" OT Goal Formulation: With patient Time For Goal Achievement: 12/29/16 Potential to Achieve Goals: Good ADL Goals Pt Will Perform Grooming: Independently;standing Pt Will Perform Upper Body Dressing: Independently;sitting;standing Pt Will Perform Lower Body Dressing: Independently;sit to/from stand Pt Will Transfer to Toilet: Independently;ambulating;regular height toilet Pt Will Perform Toileting - Clothing Manipulation and hygiene: Independently;sit to/from stand Additional ADL Goal #1: pt will be able to path find without vcs  Plan Discharge plan remains appropriate    Co-evaluation                 AM-PAC PT "6 Clicks" Daily Activity     Outcome Measure   Help from another person eating meals?: None Help from another person taking care of personal grooming?: None Help from another person toileting, which includes using toliet, bedpan, or urinal?: A Little Help from another person bathing (including washing, rinsing, drying)?: A Little Help from another person to put on and taking off regular upper body clothing?: None Help from another person to put on and taking off regular lower body clothing?: A Little 6 Click Score: 21    End of Session Equipment Utilized During Treatment: Gait belt;Rolling walker  OT Visit Diagnosis: Unsteadiness on  feet (R26.81)   Activity Tolerance Patient tolerated treatment well   Patient Left with call bell/phone within reach;with family/visitor present;in bed;with bed alarm set   Nurse Communication Mobility status;Other (comment)(Decreased cognition and activity tolerance)        Time: [id number] OT Time Calculation (min): 38 min  Charges: OT General Charges $OT Visit: 1 Visit OT Treatments $Self Care/Home Management : 38-52 mins  Trevor Perry MSOT, OTR/L Acute Rehab Pager: [phone] Office: [phone]   Trevor GristCharis M Blaine Perry 01/02/2017, 4:16 PM

## 2017-01-03 ENCOUNTER — Inpatient Hospital Stay (HOSPITAL_COMMUNITY): Payer: Medicare HMO

## 2017-01-03 ENCOUNTER — Encounter (HOSPITAL_COMMUNITY): Payer: Self-pay | Admitting: Neurosurgery

## 2017-01-03 HISTORY — PX: IR ANGIO INTRA EXTRACRAN SEL COM CAROTID INNOMINATE BILAT MOD SED: IMG5360

## 2017-01-03 HISTORY — PX: IR ANGIO VERTEBRAL SEL VERTEBRAL BILAT MOD SED: IMG5369

## 2017-01-03 MED ORDER — SODIUM CHLORIDE 0.9 % IV SOLN
INTRAVENOUS | Status: AC | PRN
  Administered 2017-01-03: 10 mL/h via INTRAVENOUS

## 2017-01-03 MED ORDER — LIDOCAINE HCL (PF) 1 % IJ SOLN
INTRAMUSCULAR | Status: AC | PRN
  Administered 2017-01-03: 5 mL

## 2017-01-03 MED ORDER — MIDAZOLAM HCL 2 MG/2ML IJ SOLN
INTRAMUSCULAR | Status: AC
  Filled 2017-01-03: qty 2

## 2017-01-03 MED ORDER — LIDOCAINE HCL 1 % IJ SOLN
INTRAMUSCULAR | Status: AC
  Filled 2017-01-03: qty 20

## 2017-01-03 MED ORDER — IOPAMIDOL (ISOVUE-300) INJECTION 61%
INTRAVENOUS | Status: AC
  Administered 2017-01-03: 20 mL
  Filled 2017-01-03: qty 50

## 2017-01-03 MED ORDER — HEPARIN SODIUM (PORCINE) 1000 UNIT/ML IJ SOLN
INTRAMUSCULAR | Status: AC
  Filled 2017-01-03: qty 1

## 2017-01-03 MED ORDER — IOPAMIDOL (ISOVUE-300) INJECTION 61%
INTRAVENOUS | Status: AC
  Administered 2017-01-03: 60 mL
  Filled 2017-01-03: qty 100

## 2017-01-03 MED ORDER — FENTANYL CITRATE (PF) 100 MCG/2ML IJ SOLN
INTRAMUSCULAR | Status: DC
Start: 2017-01-03 — End: 2017-01-03
  Filled 2017-01-03: qty 2

## 2017-01-03 MED ORDER — FENTANYL CITRATE (PF) 100 MCG/2ML IJ SOLN
INTRAMUSCULAR | Status: AC | PRN
  Administered 2017-01-03: 25 ug via INTRAVENOUS

## 2017-01-03 MED ORDER — NIMODIPINE 30 MG PO CAPS: 60.0000 mg | ORAL_CAPSULE | ORAL | 0 refills | Status: DC

## 2017-01-03 MED ORDER — MIDAZOLAM HCL 2 MG/2ML IJ SOLN
INTRAMUSCULAR | Status: AC | PRN
  Administered 2017-01-03: 1 mg via INTRAVENOUS

## 2017-01-03 NOTE — Progress Notes (Signed)
Physical Therapy Treatment Patient Details Name: Trevor ApplebaumRoy Meigs MRN: [id number] DOB: [birth year] Today's Date: 01/03/2017    History of Present Illness 67 y.[facility]. [facility], EVC placed 12/21 and D/C 12/28. Pt with fall 1/1. PMHx: back sx    PT Comments    Pt reports generalized fatigue from lack of sleep as well as extreme pain in bil buttock and posterior thigh limiting functional mobility. Pt only able to tolerate in room gait today due to posterior thigh pain and fatigue. Pt with 5/5 strength bil hip flexion, bil knee extension/flexion. Pt with bil tight hamstrings and sensation intact but pain limiting all activities. Pt with decreased balance and only able to stand 2 sec with eyes closed today compared to 15 sec last session. Discussed pt decline in function with pt and RN. At this time pt unable to mobilize well enough for OPPT and based on today will require HHPT and RW for safety with mobility at home.     Follow Up Recommendations  Home health PT     Equipment Recommendations  Rolling walker with 5" wheels    Recommendations for Other Services       Precautions / Restrictions Precautions Precautions: Fall    Mobility  Bed Mobility Overal bed mobility: Needs Assistance Bed Mobility: Sit to Supine       Sit to supine: Supervision   General bed mobility comments: supervision for safety, increased time with pt reporting increased pain  Transfers Overall transfer level: Needs assistance   Transfers: Sit to/from Stand Sit to Stand: Min assist         General transfer comment: min assist to rise from chair with increased time and pt reporting excessive buttock pain limiting rise  Ambulation/Gait Ambulation/Gait assistance: Min guard Ambulation Distance (Feet): 30 Feet Assistive device: None Gait Pattern/deviations: Step-through pattern;Decreased stride length   Gait velocity interpretation: Below  normal speed for age/gender General Gait Details: pt only able to tolerate in room activity due to pain and feeling as though legs will give out. Pt did walk 100' earlier today with nursing. pt with decreased gait speed and fatigues very quickly   Stairs            Wheelchair Mobility    Modified Rankin (Stroke Patients Only) Modified Rankin (Stroke Patients Only) Pre-Morbid Rankin Score: No symptoms Modified Rankin: No significant disability     Balance                           Rhomberg - Eyes Closed: 2                Cognition Arousal/Alertness: Awake/alert Behavior During Therapy: WFL for tasks assessed/performed Overall Cognitive Status: Within Functional Limits for tasks assessed                                 General Comments: pt with improved memory and cognition and able to recall events of admission      Exercises      General Comments        Pertinent Vitals/Pain Pain Score: 8  Pain Location: bil buttock pain Pain Descriptors / Indicators: Aching;Squeezing Pain Intervention(s): Limited activity within patient's tolerance;Repositioned;Monitored during session    Home Living  Prior Function            PT Goals (current goals can now be found in the care plan section) Progress towards PT goals: Not progressing toward goals - comment(pt with significant decline in function, balance and gait)    Frequency    Min 3X/week      PT Plan Discharge plan needs to be updated    Co-evaluation              AM-PAC PT "6 Clicks" Daily Activity  Outcome Measure  Difficulty turning over in bed (including adjusting bedclothes, sheets and blankets)?: A Little Difficulty moving from lying on back to sitting on the side of the bed? : A Little Difficulty sitting down on and standing up from a chair with arms (e.g., wheelchair, bedside commode, etc,.)?: Unable Help needed moving to and from  a bed to chair (including a wheelchair)?: None Help needed walking in hospital room?: A Little Help needed climbing 3-5 steps with a railing? : A Little 6 Click Score: 17    End of Session Equipment Utilized During Treatment: Gait belt Activity Tolerance: Patient limited by fatigue;Patient limited by pain Patient left: in bed;with call bell/phone within reach;with family/visitor present Nurse Communication: Mobility status PT Visit Diagnosis: Other abnormalities of gait and mobility (R26.89);Pain Pain - Right/Left: Left Pain - part of body: Hip     Time: 9562-1308 PT Time Calculation (min) (ACUTE ONLY): 23 min  Charges:  $Gait Training: 8-22 mins $Therapeutic Activity: 8-22 mins                    G Codes:       [name], PT [phone]    Mozella Rexrode B Richerd Grime 01/03/2017, 12:23 PM

## 2017-01-03 NOTE — Sedation Documentation (Signed)
Patient is resting comfortably. 

## 2017-01-03 NOTE — Progress Notes (Signed)
SLP Cancellation Note  Patient Details Name: Trevor Perry MRN: [id number] DOB: 1950/06/04   Cancelled treatment:       Reason Eval/Treat Not Completed: Patient at procedure or test/unavailable   [name] 01/03/2017, 2:50 PM

## 2017-01-03 NOTE — Care Management Note (Signed)
Case Management Note  Patient Details  Name: Trevor ApplebaumRoy Rueb MRN: [id number] Date of Birth: 04/20/1950  Subjective/Objective:   Pt admitted on 12/20/16 with SAH.  PTA, pt independent, lives with spouse.                   Action/Plan: Pt currently remains on Nimotop.  Will follow for discharge needs as pt progresses.    Expected Discharge Date:  01/03/17               Expected Discharge Plan:  Home w Home Health Services  In-House Referral:     Discharge planning Services  CM Consult, Medication Assistance  Post Acute Care Choice:  Home Health Choice offered to:  Patient, Spouse  DME Arranged:    DME Agency:     HH Arranged:  PT HH Agency:  [facility]  Status of Service:  Completed, signed off  If discussed at Long Length of Stay Meetings, dates discussed:    Additional Comments:  01/03/17 J. Ladislaus Repsher, RN, BSN Pt medically stable for discharge home today with spouse.  PT recommending HH follow up, and order obtained from MD.  Pt agreeable to home therapy; [facility].  Start of care 24-48h post dc date.  No DME needs, per pt, wife.  Nimotop to continue x 1 more week, and Rx sent to [facility] in [place].  Pharmacy does not have in stock and plans to order;  will have med in by 10a-11a on 01/04/17.  MD made aware that pt may miss Nimotop dose, and he is okay with this.  Pt plans to dc to son's home in [place].    Quintella BatonJulie W. Jakelin Taussig, RN, BSN  Trauma/Neuro ICU Case Manager [phone]

## 2017-01-03 NOTE — Progress Notes (Signed)
Pt seen and examined. No issues overnight. No real HA this am. Some back pain. No new N/T/W.  EXAM: Temp:  [97.7 F (36.5 C)-99.1 F (37.3 C)] 97.7 F (36.5 C) (01/03 0800) Pulse Rate:  [68-106] 83 (01/03 0930) Resp:  [11-24] 24 (01/03 0930) BP: (107-173)/(59-109) 123/80 (01/03 0930) SpO2:  [92 %-99 %] 97 % (01/03 0930) Intake/Output      01/02 0701 - 01/03 0700 01/03 0701 - 01/04 0700   P.O. 660    Total Intake(mL/kg) 660 (6.4)    Net +660         Urine Occurrence 5 x 1 x    Awake, alert, oriented Speech fluent CN intact Good strength throughout  LABS: Lab Results  Component Value Date   CREATININE 0.99 12/20/2016   BUN 20 12/20/2016   NA 137 12/20/2016   K 3.4 (L) 12/20/2016   CL 102 12/20/2016   CO2 25 12/20/2016   Lab Results  Component Value Date   WBC 5.7 12/20/2016   HGB 14.6 12/20/2016   HCT 42.3 12/20/2016   MCV 88.9 12/20/2016   PLT 196 12/20/2016    IMPRESSION: - 67 y.o. male ~2wks s/p SAH, initial angio (-), no signs of HCP.  PLAN: - Will repeat diagnostic angiogram today, if negative can likely d/c home.

## 2017-01-03 NOTE — Discharge Summary (Signed)
  Physician Discharge Summary  Patient ID: Trevor Perry MRN: [id number] DOB/AGE: 01/13/1950 67 y.o.  Admit date: 12/20/2016 Discharge date: 01/03/2017  Admission Diagnoses:  Subarachnoid hemorrhage  Discharge Diagnoses:  Same Active Problems:   Subarachnoid hemorrhage [facility](HCC)   Discharged Condition: Stable  Hospital Course:  Trevor Perry is a 67 y.o. male admitted after presenting with sudden onset of severe headache.  CT scan demonstrated subarachnoid hemorrhage.  Initial angiogram was negative for intracranial aneurysm.  Ventriculostomy catheter was placed for CSF drainage.  He was observed in the intensive care unit where he remained neurologically stable.  The ventriculostomy was weaned and removed without any evidence of clinical hydrocephalus.  Repeat angiogram did not demonstrate any intracranial aneurysms.  There was some mild vasospasm, although he was clinically asymptomatic.  He was seen by physical therapy, where he had some balance issues and home PT was recommended.  He was otherwise stable for discharge.  Discharge Exam: Blood pressure (!) 165/87, pulse 67, temperature 98.3 F (36.8 C), temperature source Oral, resp. rate 15, height 6' (1.829 m), weight 102.6 kg (226 lb 3.1 oz), SpO2 98 %. Awake, alert, oriented Speech fluent, appropriate CN grossly intact 5/5 BUE/BLE Wound c/d/i  Disposition: Home  Discharge Instructions    Call MD for:  redness, tenderness, or signs of infection (pain, swelling, redness, odor or green/yellow discharge around incision site)   Complete by:  As directed    Call MD for:  temperature >100.4   Complete by:  As directed    Diet - low sodium heart healthy   Complete by:  As directed    Discharge instructions   Complete by:  As directed    Walk at home as much as possible, at least 4 times / day   Increase activity slowly   Complete by:  As directed    Lifting restrictions   Complete by:  As directed    No lifting > 10 lbs   May shower /  Bathe   Complete by:  As directed    48 hours after surgery   May walk up steps   Complete by:  As directed    No dressing needed   Complete by:  As directed    Other Restrictions   Complete by:  As directed    No bending/twisting at waist     Allergies as of 01/03/2017      Reactions   Atorvastatin Other (See Comments)   Myalgias   Fenofibrate Other (See Comments)   Myalgias   Fenofibrate Micronized Other (See Comments)   Myalgias      Medication List    TAKE these medications   indapamide 1.25 MG tablet Commonly known as:  LOZOL Take 1.25 mg by mouth daily.   niMODipine 30 MG capsule Commonly known as:  NIMOTOP Take 2 capsules (60 mg total) by mouth every 4 (four) hours.      Follow-up Information    Lisbeth RenshawNundkumar, Ericka Marcellus, MD Follow up in 4 week(s).   Specialty:  Neurosurgery Contact information: [address] [place] [address] [phone]           Signed: Jackelyn Hoehneelesh C Candance Bohlman 01/03/2017, 4:16 PM

## 2017-01-03 NOTE — Progress Notes (Signed)
Discharge orders received, pt for discharge home today with home health PT per [facility] (pt wife refusing the recommended rolling walker), IV D/C with dressing CDI to R groin, angioseal intact.  D/C instructions and Rx sent into their local pharmacy with verbalized understanding, specifically in regards to post-IR procedure care.  Family at bedside to assist pt with discharge. Staff brought pt downstairs via wheelchair.

## 2017-01-03 NOTE — Progress Notes (Signed)
Per IR RN, pt is to be on flat bedrest until 3 hours from now, 1554. Pt and family aware.  Pt to be discharged after this time frame.

## 2017-01-09 ENCOUNTER — Encounter (HOSPITAL_COMMUNITY): Payer: Self-pay | Admitting: Neurosurgery

## 2017-02-27 ENCOUNTER — Encounter: Payer: Self-pay | Admitting: Neurology

## 2017-02-27 ENCOUNTER — Ambulatory Visit: Payer: Medicare HMO | Admitting: Neurology

## 2017-02-27 ENCOUNTER — Other Ambulatory Visit: Payer: Self-pay

## 2017-02-27 VITALS — BP 132/76 | HR 80 | Ht 72.0 in | Wt 214.0 lb

## 2017-02-27 DIAGNOSIS — I609 Nontraumatic subarachnoid hemorrhage, unspecified: Secondary | ICD-10-CM | POA: Diagnosis not present

## 2017-02-27 NOTE — Progress Notes (Signed)
Reason for visit: Subarachnoid hemorrhage  Referring physician: Dr. Iona CoachNundkumar  Trevor Perry is a 67 y.o. male  History of present illness:  Trevor Perry is a 67 year old right-handed white male with a history of a nontraumatic subarachnoid hemorrhage that occurred on 20 December 2016.  The patient had a cerebral angiogram that did not locate the source of the intracranial hemorrhage.  The patient required a ventriculostomy tube temporarily that was taken out before discharge.  The patient initially had some headaches, a decline in memory and concentration, and some back pain associated with irritation from the subarachnoid blood.  The patient has had an excellent recovery, he feels normal at this point, he denies any memory problems or difficulty with back pain.  He has not had any residual numbness or weakness of the face, arms, or legs.  The patient denies any balance issues or difficulty controlling the bowels or the bladder.  He has not had any dizziness, difficulty speech or swallowing, but he has not reported any episodes of confusion, loss of consciousness or seizures.  The patient works as a Naval architecttruck driver, he needs clearance to return to work in this capacity.  The patient comes to this office for further evaluation.  Past Medical History:  Diagnosis Date  . Kidney stone     Past Surgical History:  Procedure Laterality Date  . BACK SURGERY    . IR ANGIO INTRA EXTRACRAN SEL COM CAROTID INNOMINATE BILAT MOD SED  01/03/2017  . IR ANGIO INTRA EXTRACRAN SEL INTERNAL CAROTID BILAT MOD SED  12/21/2016  . IR ANGIO VERTEBRAL SEL VERTEBRAL BILAT MOD SED  12/21/2016  . IR ANGIO VERTEBRAL SEL VERTEBRAL BILAT MOD SED  01/03/2017  . RADIOLOGY WITH ANESTHESIA N/A 12/21/2016   Procedure: RADIOLOGY WITH ANESTHESIA;  Surgeon: Radiologist, Medication, MD;  Location: [facility] OR;  Service: Radiology;  Laterality: N/A;    History reviewed. No pertinent family history.  Social history:  reports that  has never  smoked. he has never used smokeless tobacco. He reports that he does not drink alcohol or use drugs.  Medications:  Prior to Admission medications   Medication Sig Start Date End Date Taking? Authorizing Provider  indapamide (LOZOL) 1.25 MG tablet Take 1.25 mg by mouth daily. 12/12/16  Yes [provider]  KRILL OIL PO Take 1 capsule by mouth daily.   Yes [provider]  losartan (COZAAR) 25 MG tablet Take 25 mg by mouth daily. 01/16/17  Yes [provider]  Multiple Vitamins-Minerals (MULTIVITAMIN PO) Take 1 tablet by mouth daily.   Yes [provider]  Red Yeast Rice Extract (RED YEAST RICE PO) Take 1 tablet by mouth daily.   Yes [provider]      Allergies  Allergen Reactions  . Atorvastatin Other (See Comments)    Myalgias   . Fenofibrate Other (See Comments)    Myalgias    . Fenofibrate Micronized Other (See Comments)    Myalgias    ROS:  Out of a complete 14 system review of symptoms, the patient complains only of the following symptoms, and all other reviewed systems are negative.  History of subarachnoid hemorrhage  Blood pressure 132/76, pulse 80, height 6' (1.829 m), weight 214 lb (97.1 kg).  Physical Exam  General: The patient is alert and cooperative at the time of the examination.  Eyes: Pupils are equal, round, and reactive to light. Discs are flat bilaterally.  Neck: The neck is supple, no carotid bruits are noted.  Respiratory: The respiratory examination is clear.  Cardiovascular: The cardiovascular examination reveals a regular rate and rhythm, no obvious murmurs or rubs are noted.  Skin: Extremities are without significant edema.  Neurologic Exam  Mental status: The patient is alert and oriented x 3 at the time of the examination. The patient has apparent normal recent and remote memory, with an apparently normal attention span and concentration ability.  Cranial nerves: Facial symmetry is present.  There is good sensation of the face to pinprick and soft touch bilaterally. The strength of the facial muscles and the muscles to head turning and shoulder shrug are normal bilaterally. Speech is well enunciated, no aphasia or dysarthria is noted. Extraocular movements are full. Visual fields are full. The tongue is midline, and the patient has symmetric elevation of the soft palate. No obvious hearing deficits are noted.  Motor: The motor testing reveals 5 over 5 strength of all 4 extremities. Good symmetric motor tone is noted throughout.  Sensory: Sensory testing is intact to pinprick, soft touch, vibration sensation, and position sense on all 4 extremities. No evidence of extinction is noted.  Coordination: Cerebellar testing reveals good finger-nose-finger and heel-to-shin bilaterally.  Gait and station: Gait is normal. Tandem gait is normal. Romberg is negative. No drift is seen.  Reflexes: Deep tendon reflexes are symmetric and normal bilaterally. Toes are downgoing bilaterally.   Assessment/Plan:  1.  Nontraumatic subarachnoid hemorrhage  The patient has made a full recovery, he has been operating a motor vehicle for at least one month, he is doing well with this.  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  He will return to this office on as-needed basis.  Marlan Palau MD 02/27/2017 3:53 PM  [facility] [address] [place], [place] [address]  Phone [phone] Fax [phone]

## 2017-04-12 ENCOUNTER — Ambulatory Visit: Payer: Medicare HMO | Admitting: Diagnostic Neuroimaging

## 2018-01-14 ENCOUNTER — Encounter: Payer: Self-pay | Admitting: Neurology

## 2018-01-14 ENCOUNTER — Ambulatory Visit: Payer: Medicare HMO | Admitting: Neurology

## 2018-01-14 VITALS — BP 142/78 | HR 85 | Ht 72.0 in | Wt 224.0 lb

## 2018-01-14 DIAGNOSIS — I609 Nontraumatic subarachnoid hemorrhage, unspecified: Secondary | ICD-10-CM | POA: Diagnosis not present

## 2018-01-14 NOTE — Progress Notes (Signed)
Reason for visit: Nontraumatic subarachnoid hemorrhage  Trevor Perry is an 68 y.o. male  History of present illness:  Trevor Perry is a 68 year old right-handed white male with a history of a prior spontaneous nontraumatic subarachnoid hemorrhage that occurred in December 2018.  The patient required ventriculotomy tube that was removed prior to discharge.  The patient is done quite well, he has recovered fully.  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  He requires medical clearance for this.  The patient is not had any headaches, vision changes, numbness or weakness, balance problems, or difficulty controlling the bowels or the bladder.  No other new significant issues have come up since last seen in regards to his medical history.  Past Medical History:  Diagnosis Date  . Kidney stone     Past Surgical History:  Procedure Laterality Date  . BACK SURGERY    . IR ANGIO INTRA EXTRACRAN SEL COM CAROTID INNOMINATE BILAT MOD SED  01/03/2017  . IR ANGIO INTRA EXTRACRAN SEL INTERNAL CAROTID BILAT MOD SED  12/21/2016  . IR ANGIO VERTEBRAL SEL VERTEBRAL BILAT MOD SED  12/21/2016  . IR ANGIO VERTEBRAL SEL VERTEBRAL BILAT MOD SED  01/03/2017  . RADIOLOGY WITH ANESTHESIA N/A 12/21/2016   Procedure: RADIOLOGY WITH ANESTHESIA;  Surgeon: Radiologist, Medication, MD;  Location: [facility] OR;  Service: Radiology;  Laterality: N/A;    History reviewed. No pertinent family history.  Social history:  reports that he has never smoked. He has never used smokeless tobacco. He reports that he does not drink alcohol or use drugs.    Allergies  Allergen Reactions  . Atorvastatin Other (See Comments)    Myalgias   . Fenofibrate Other (See Comments)    Myalgias    . Fenofibrate Micronized Other (See Comments)    Myalgias    Medications:  Prior to Admission medications   Medication Sig Start  Date End Date Taking? Authorizing Provider  aspirin 81 MG tablet Take 81 mg by mouth daily.   Yes [provider]  indapamide (LOZOL) 1.25 MG tablet Take 1.25 mg by mouth daily. 12/12/16  Yes [provider]  KRILL OIL PO Take 1 capsule by mouth daily.   Yes [provider]  losartan (COZAAR) 25 MG tablet Take 25 mg by mouth daily. 01/16/17  Yes [provider]  magnesium 30 MG tablet Take 30 mg by mouth 2 (two) times daily.   Yes [provider]  Multiple Vitamins-Minerals (MULTIVITAMIN PO) Take 1 tablet by mouth daily.   Yes [provider]  Red Yeast Rice Extract (RED YEAST RICE PO) Take 1 tablet by mouth daily.   Yes [provider]    ROS:  Out of a complete 14 system review of symptoms, the patient complains only of the following symptoms, and all other reviewed systems are negative.  Hearing loss Neck stiffness  Blood pressure (!) 142/78, pulse 85, height 6' (1.829 m), weight 224 lb (101.6 kg), SpO2 97 %.  Physical Exam  General: The patient is alert and cooperative at the time of the examination.  Skin: No significant peripheral edema is noted.   Neurologic Exam  Mental status: The patient is alert and oriented x 3 at the time of the examination. The patient has apparent normal recent and remote memory, with an apparently normal attention span and concentration ability.   Cranial nerves:  Facial symmetry is present. Speech is normal, no aphasia or dysarthria is noted. Extraocular movements are full. Visual fields are full.  Motor: The patient has good strength in all 4 extremities.  Sensory examination: Soft touch sensation is symmetric on the face, arms, and legs.  Coordination: The patient has good finger-nose-finger and heel-to-shin bilaterally.  Gait and station: The patient has a normal gait. Tandem gait is normal. Romberg is negative. No drift is seen.  Reflexes: Deep tendon reflexes are  symmetric.   Assessment/Plan:  1.  Nontraumatic subarachnoid hemorrhage, recovered  The patient is doing quite well at this time, his prognosis is excellent, the cerebral angiogram done was negative.  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  He will follow-up here if needed.  Marlan Palau MD 01/14/2018 2:39 PM  [facility] [address] [place], [place] [address]  Phone [phone] Fax [phone]

## 2018-10-28 IMAGING — CT CT HEAD W/O CM
3 series · 15 of 47 positions shown, 18 images · non-contrast
Comparison: 12/27/2016 CT of the head.

CLINICAL DATA: 66 y/o  M; subarachnoid hemorrhage for follow-up.

EXAM:
CT HEAD WITHOUT CONTRAST
TECHNIQUE: Contiguous axial images were obtained from the base of the skull
through the vertex without intravenous contrast.

[Series 3: head 5.0 h30s · axial · 0.47mm/px · z∈[+1064,+1224]mm · 9 of 38 slices shown, 12 images]
[im 3/38  brain]
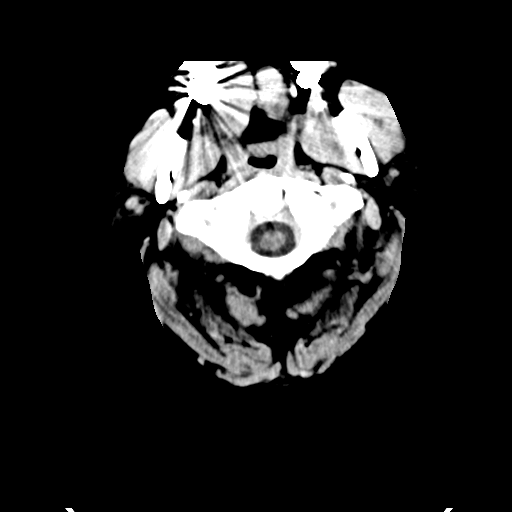
[im 3/38  bone]
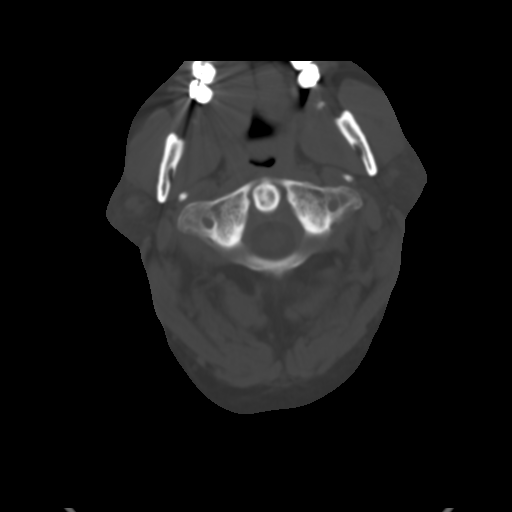
[im 7/38  brain]
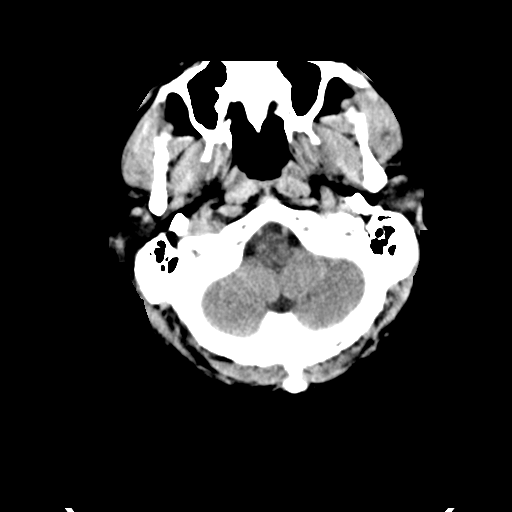
[im 11/38  brain]
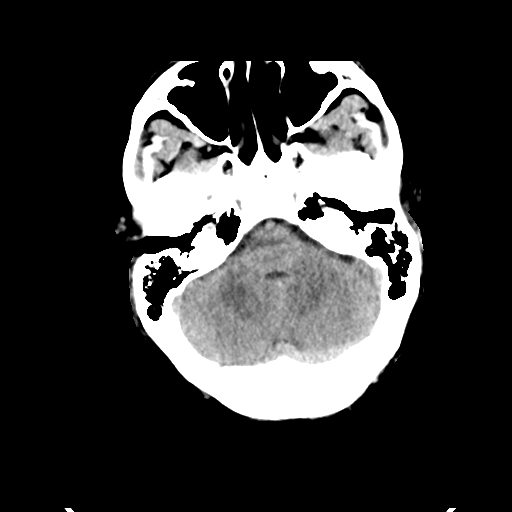
[im 15/38  brain]
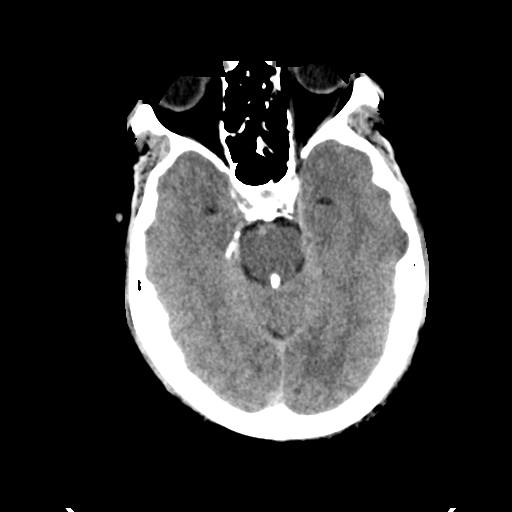
[im 20/38  brain]
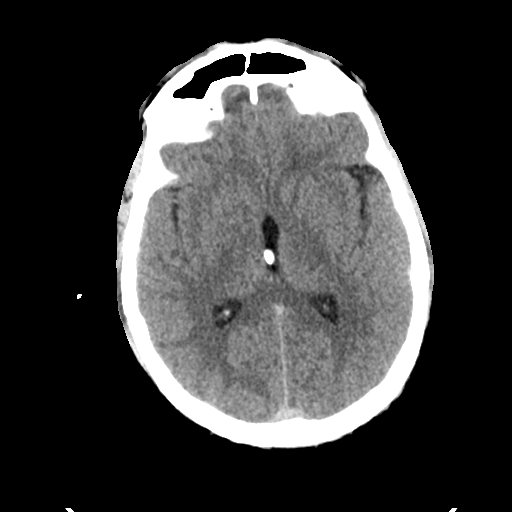
[im 20/38  bone]
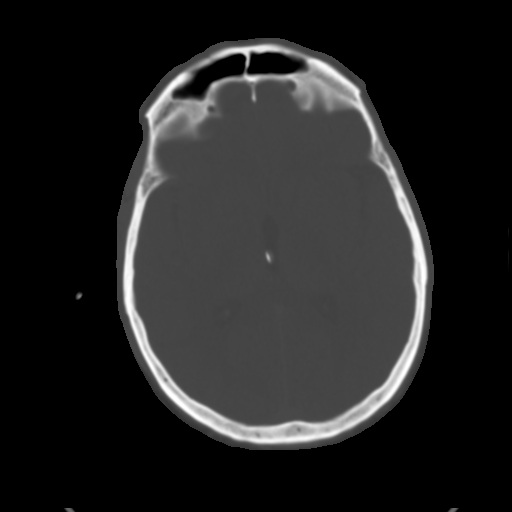
[im 23/38  brain]
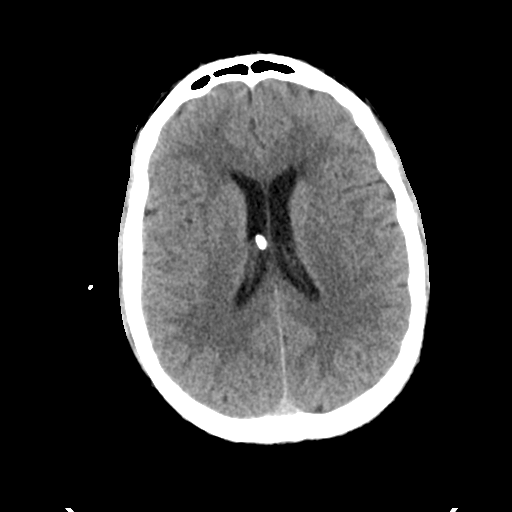
[im 27/38  brain]
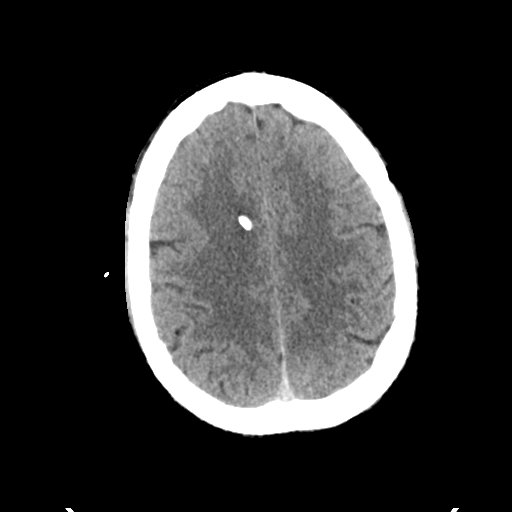
[im 31/38  brain]
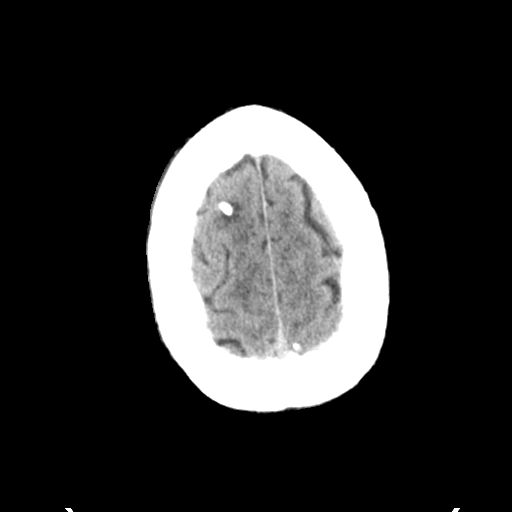
[im 35/38  brain]
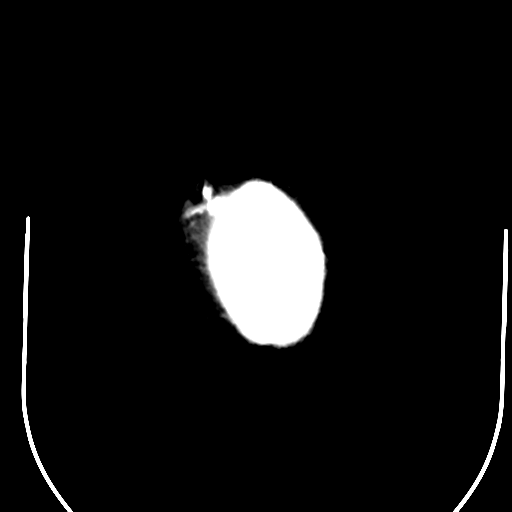
[im 35/38  bone]
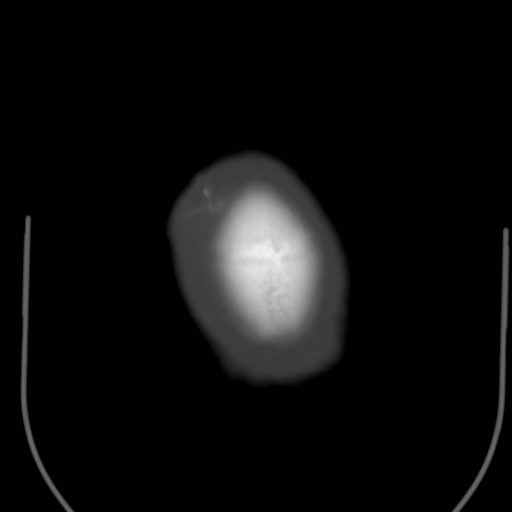

[Series 5: head 3.0 mpr cor · coronal · 0.37mm/px · 3 of 67 slices shown]
[im 23/67  brain]
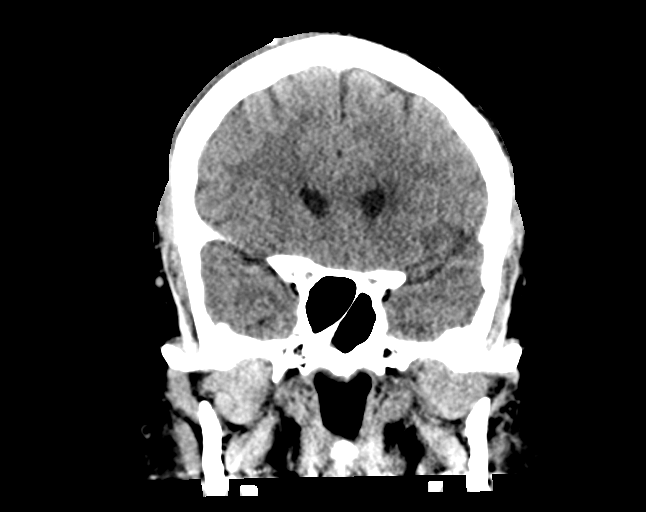
[im 30/67  brain]
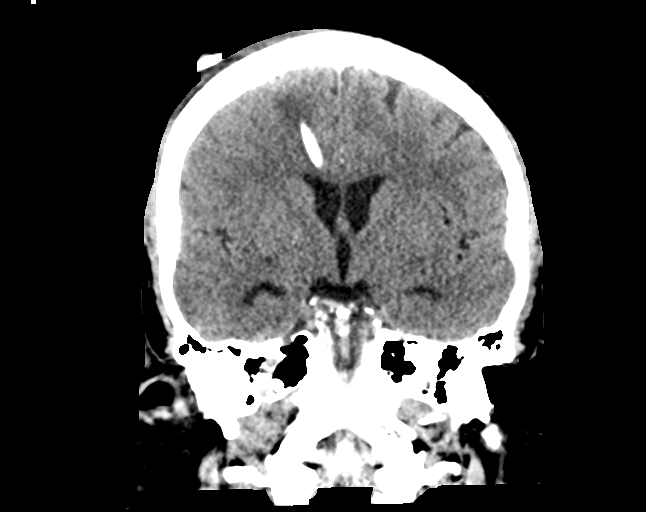
[im 37/67  brain]
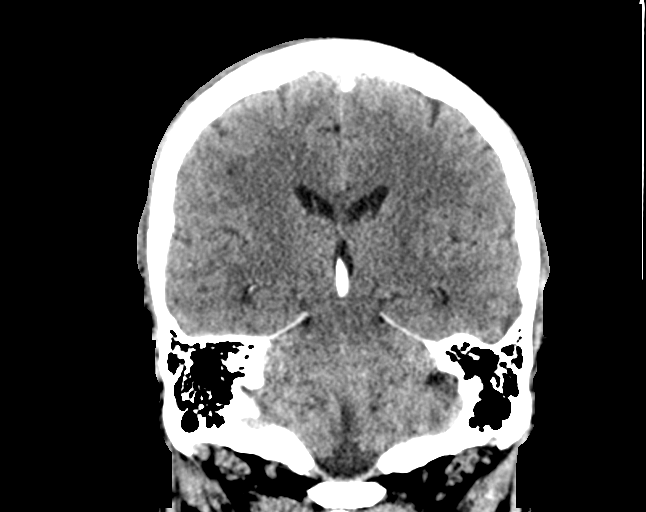

[Series 6: head 3.0 mpr sag · sagittal · 0.37mm/px · 3 of 67 slices shown]
[im 23/67  brain]
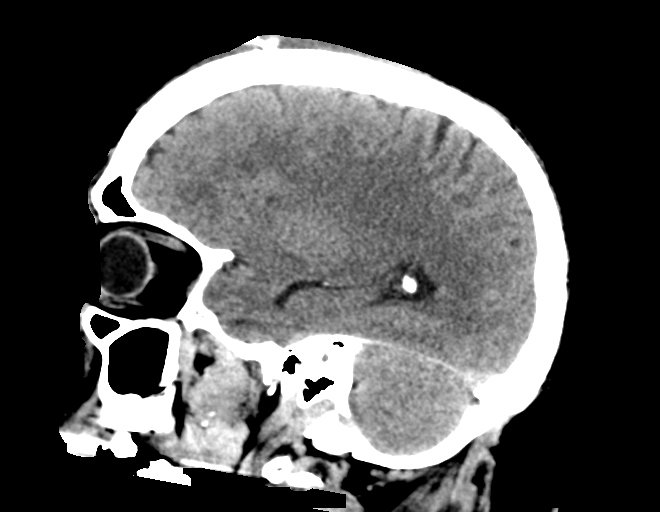
[im 34/67  brain]
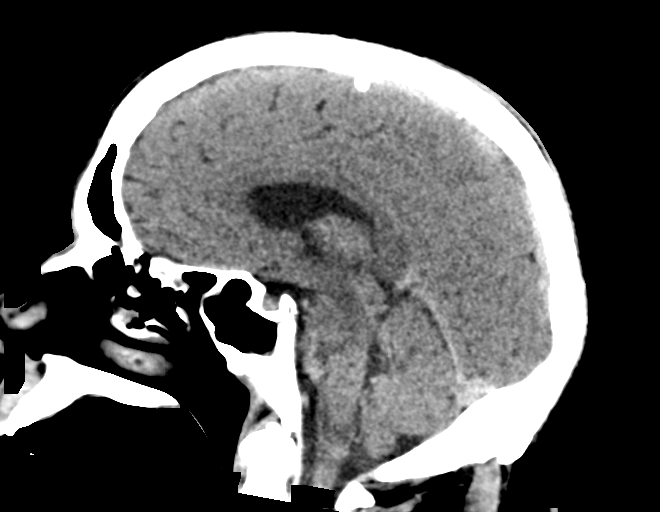
[im 45/67  brain]
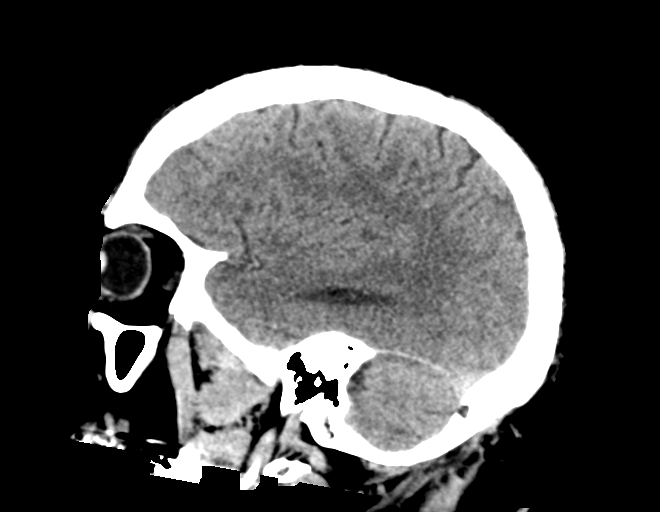

[15 of 47 positions shown; findings below may reference images not displayed]

FINDINGS: Brain: Stable right frontal approach ventriculostomy catheter with
tip in the cerebral aqueduct. Stable small area of hypoattenuation
along the course of the catheter likely representing postprocedural
edema. Stable ventricle size. Stable minimal subarachnoid hemorrhage
in prepontine cistern. No new acute intracranial hemorrhage, large
acute infarct, or focal mass effect.

Vascular: No hyperdense vessel or unexpected calcification.

Skull: Stable postsurgical changes related to right frontal burr
hole.

Sinuses/Orbits: Mild diffuse paranasal sinus mucosal thickening.
Normal aeration of mastoid air cells. Orbits are unremarkable.

Other: None.
IMPRESSION: Stable minimal subarachnoid hemorrhage in the pontine cistern.
Stable ventricle size and position of ventriculostomy catheter. No
new acute intracranial abnormality.

By: Diamond Lea M.D.

## 2018-12-29 NOTE — Progress Notes (Signed)
PATIENT: Trevor Perry DOB: 1950/08/08  REASON FOR VISIT: follow up HISTORY FROM: patient  HISTORY OF PRESENT ILLNESS: Today 12/30/18  Trevor Perry Is a 68 year old male with history of prior spontaneous nontraumatic subarachnoid hemorrhage in December 2018.  He required a ventriculostomy tube that was taken out prior to discharge.  He has continued to do well and he has fully recovered.  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  He requires medical clearance for this, with a yearly visit to see our office.  He denies any headaches, vision changes, numbness or weakness, balance problems, or difficulty controlling the bowels or bladder.  He did acquire Covid at the end of October.  He has remained very active, has been working, and doing this quite well.  He has not had any falls.  He has an annual physical.  He says he is planning to have a hearing test next week.  He presents today for evaluation.  HISTORY 01/14/2018 Dr. Anne HahnWillis: Trevor Perry is a 68 year old right-handed white male with a history of a prior spontaneous nontraumatic subarachnoid hemorrhage that occurred in December 2018.  The patient required ventriculotomy tube that was removed prior to discharge.  The patient is done quite well, he has recovered fully.  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  He requires medical clearance for this.  The patient is not had any headaches, vision changes, numbness or weakness, balance problems, or difficulty controlling the bowels or the bladder.  No other new significant issues have come up since last seen in regards to his medical history  REVIEW OF SYSTEMS: Out of a complete 14 system review of symptoms, the patient complains only of the following symptoms, and all other  reviewed systems are negative.  N/A  ALLERGIES: Allergies  Allergen Reactions  . Atorvastatin Other (See Comments)    Myalgias   . Fenofibrate Other (See Comments)    Myalgias    . Fenofibrate Micronized Other (See Comments)    Myalgias    HOME MEDICATIONS: Outpatient Medications Prior to Visit  Medication Sig Dispense Refill  . aspirin 81 MG tablet Take 81 mg by mouth daily.    . indapamide (LOZOL) 1.25 MG tablet Take 1.25 mg by mouth daily.  2  . KRILL OIL PO Take 1 capsule by mouth daily.    Marland Kitchen. losartan (COZAAR) 25 MG tablet Take 25 mg by mouth daily.    . magnesium 30 MG tablet Take 30 mg by mouth 2 (two) times daily.    . Multiple Vitamins-Minerals (MULTIVITAMIN PO) Take 1 tablet by mouth daily.    . Red Yeast Rice Extract (RED YEAST RICE PO) Take 1 tablet by mouth daily.     No facility-administered medications prior to visit.    PAST MEDICAL HISTORY: Past Medical History:  Diagnosis Date  . Kidney stone     PAST SURGICAL HISTORY: Past Surgical History:  Procedure Laterality Date  . BACK SURGERY    . IR ANGIO INTRA EXTRACRAN SEL COM CAROTID INNOMINATE BILAT MOD SED  01/03/2017  . IR ANGIO INTRA EXTRACRAN SEL INTERNAL CAROTID BILAT MOD SED  12/21/2016  . IR ANGIO VERTEBRAL SEL VERTEBRAL BILAT MOD SED  12/21/2016  . IR ANGIO VERTEBRAL SEL VERTEBRAL BILAT MOD SED  01/03/2017  .  RADIOLOGY WITH ANESTHESIA N/A 12/21/2016   Procedure: RADIOLOGY WITH ANESTHESIA;  Surgeon: Radiologist, Medication, MD;  Location: [facility] OR;  Service: Radiology;  Laterality: N/A;    FAMILY HISTORY: History reviewed. No pertinent family history.  SOCIAL HISTORY: Social History   Socioeconomic History  . Marital status: Married    Spouse name: Jamesetta So  . Number of children: 2  . Years of education: BS  . Highest education level: Not on file  Occupational History  . Occupation: [employer]  Tobacco Use  . Smoking status: Never Smoker  . Smokeless tobacco: Never Used  Substance and  Sexual Activity  . Alcohol use: No  . Drug use: No  . Sexual activity: Not on file  Other Topics Concern  . Not on file  Social History Narrative   Lives with wife Jamesetta So)   Caffeine use: Dink 1 cup coffee every morning   Soda/tea- rare   Right handed    Social Determinants of Health   Financial Resource Strain:   . Difficulty of Paying Living Expenses: Not on file  Food Insecurity:   . Worried About Programme researcher, broadcasting/film/video in the Last Year: Not on file  . Ran Out of Food in the Last Year: Not on file  Transportation Needs:   . Lack of Transportation (Medical): Not on file  . Lack of Transportation (Non-Medical): Not on file  Physical Activity:   . Days of Exercise per Week: Not on file  . Minutes of Exercise per Session: Not on file  Stress:   . Feeling of Stress : Not on file  Social Connections:   . Frequency of Communication with Friends and Family: Not on file  . Frequency of Social Gatherings with Friends and Family: Not on file  . Attends Religious Services: Not on file  . Active Member of Clubs or Organizations: Not on file  . Attends Banker Meetings: Not on file  . Marital Status: Not on file  Intimate Partner Violence:   . Fear of Current or Ex-Partner: Not on file  . Emotionally Abused: Not on file  . Physically Abused: Not on file  . Sexually Abused: Not on file   PHYSICAL EXAM  Vitals:   12/30/18 0716  BP: (!) 142/72  Pulse: 83  Temp: (!) 96.8 F (36 C)  TempSrc: Oral  Weight: 222 lb 12.8 oz (101.1 kg)  Height: 6' (1.829 m)   Body mass index is 30.22 kg/m.  Generalized: Well developed, in no acute distress   Neurological examination  Mentation: Alert oriented to time, place, history taking. Follows all commands speech and language fluent Cranial nerve II-XII: Pupils were equal round reactive to light. Extraocular movements were full, visual field were full on confrontational test. Facial sensation and strength were normal. Head  turning and shoulder shrug  were normal and symmetric. Motor: The motor testing reveals 5 over 5 strength of all 4 extremities. Good symmetric motor tone is noted throughout.  Sensory: Sensory testing is intact to soft touch on all 4 extremities. No evidence of extinction is noted.  Coordination: Cerebellar testing reveals good finger-nose-finger and heel-to-shin bilaterally.  Gait and station: Gait is normal. Tandem gait is normal. Romberg is negative. No drift is seen.  Reflexes: Deep tendon reflexes are symmetric and normal bilaterally.   DIAGNOSTIC DATA (LABS, IMAGING, TESTING) - I reviewed patient records, labs, notes, testing and imaging myself where available.  Lab Results  Component Value Date   WBC 5.7 12/20/2016  HGB 14.6 12/20/2016   HCT 42.3 12/20/2016   MCV 88.9 12/20/2016   PLT 196 12/20/2016      Component Value Date/Time   NA 137 12/20/2016 2030   K 3.4 (L) 12/20/2016 2030   CL 102 12/20/2016 2030   CO2 25 12/20/2016 2030   GLUCOSE 110 (H) 12/20/2016 2030   BUN 20 12/20/2016 2030   CREATININE 0.99 12/20/2016 2030   CALCIUM 8.9 12/20/2016 2030   GFRNONAA >60 12/20/2016 2030   GFRAA >60 12/20/2016 2030   No results found for: CHOL, HDL, LDLCALC, LDLDIRECT, TRIG, CHOLHDL No results found for: HGBA1C No results found for: VITAMINB12 No results found for: TSH  ASSESSMENT AND PLAN 68 y.o. year old male  has a past medical history of Kidney stone. here with:  1.  Nontraumatic subarachnoid hemorrhage, recovered  He has continued to do quite well, his prognosis is excellent.  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  After last visit, Dr. Jannifer Franklin provided him with a note to provide for his DOT physical. I will provide an updated note today.  He will follow up here on an as-needed basis, the DOT may require he be seen on an annual basis at our office.   I spent 15 minutes with the patient. 50% of this  time was spent discussing his plan of care.  Butler Denmark, AGNP-C, DNP 12/30/2018, 7:34 AM [facility] [address], [place], [place] [address] [phone]

## 2018-12-30 ENCOUNTER — Encounter: Payer: Self-pay | Admitting: Neurology

## 2018-12-30 ENCOUNTER — Other Ambulatory Visit: Payer: Self-pay

## 2018-12-30 ENCOUNTER — Ambulatory Visit: Payer: Medicare HMO | Admitting: Neurology

## 2018-12-30 VITALS — BP 142/72 | HR 83 | Temp 96.8°F | Ht 72.0 in | Wt 222.8 lb

## 2018-12-30 DIAGNOSIS — I609 Nontraumatic subarachnoid hemorrhage, unspecified: Secondary | ICD-10-CM

## 2018-12-30 NOTE — Patient Instructions (Signed)
I will dictate you a letter for the DOT, the same as Dr. Jannifer Franklin provided you. Continue follow-up with your primary doctor, continue to come see [place] every year if needed for your license.  Happy New Year!

## 2018-12-30 NOTE — Progress Notes (Signed)
I have read the note, and I agree with the clinical assessment and plan.  Alonzo Owczarzak K Han Vejar   

## 2019-12-29 ENCOUNTER — Ambulatory Visit: Payer: Medicare HMO | Admitting: Neurology

## 2020-01-06 NOTE — Progress Notes (Signed)
PATIENT: Trevor Perry DOB: Nov 17, 1950  REASON FOR VISIT: follow up HISTORY FROM: patient  HISTORY OF PRESENT ILLNESS: Today 01/07/20 Mr. Eagon is a 70 year old male with history of prior spontaneous nontraumatic subarachnoid hemorrhage in December 2018.  He required a ventriculostomy tube that was taken out prior to discharge.  He slowly recovered, has continued to do well.  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  He requires annual medical clearance for this.  He has done well over the last year.  He has routine follow-up with his ophthalmologist.  He wears glasses.  He has hearing aids.  He follows with urology for recurrent kidney stones.  Routine follow-up with PCP.  He has no new problems or concerns.  Presents today for evaluation unaccompanied.  HISTORY 12/30/2018 SS: Mr. Arrighi Is a 70 year old male with history of prior spontaneous nontraumatic subarachnoid hemorrhage in December 2018.  He required a ventriculostomy tube that was taken out prior to discharge.  He has continued to do well and he has fully recovered.  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  He requires medical clearance for this, with a yearly visit to see our office.  He denies any headaches, vision changes, numbness or weakness, balance problems, or difficulty controlling the bowels or bladder.  He did acquire Covid at the end of October.  He has remained very active, has been working, and doing this quite well.  He has not had any falls.  He has an annual physical.  He says he is planning to have a hearing test next week.  He presents today for evaluation.   REVIEW OF SYSTEMS: Out of a complete 14 system review of symptoms, the patient complains only of the following symptoms, and all other reviewed systems are  negative.  N/a  ALLERGIES: Allergies  Allergen Reactions  . Atorvastatin Other (See Comments)    Myalgias   . Fenofibrate Other (See Comments)    Myalgias    . Fenofibrate Micronized Other (See Comments)    Myalgias    HOME MEDICATIONS: Outpatient Medications Prior to Visit  Medication Sig Dispense Refill  . aspirin 81 MG tablet Take 81 mg by mouth daily.    . indapamide (LOZOL) 1.25 MG tablet Take 1.25 mg by mouth daily.  2  . KRILL OIL PO Take 1 capsule by mouth daily.    Marland Kitchen losartan (COZAAR) 25 MG tablet Take 25 mg by mouth daily.    . magnesium 30 MG tablet Take 30 mg by mouth 2 (two) times daily.    . Multiple Vitamins-Minerals (MULTIVITAMIN PO) Take 1 tablet by mouth daily.    . Red Yeast Rice 600 MG TABS Take 1 tablet by mouth daily.    Marland Kitchen thiamine (VITAMIN B-1) 50 MG tablet Take 50 mg by mouth daily.    Marland Kitchen zinc gluconate 50 MG tablet Take 50 mg by mouth daily. 1/2 tab     No facility-administered medications prior to visit.    PAST MEDICAL HISTORY: Past Medical History:  Diagnosis Date  . Kidney stone     PAST SURGICAL HISTORY: Past Surgical History:  Procedure Laterality Date  . BACK SURGERY    . IR ANGIO INTRA EXTRACRAN SEL COM CAROTID INNOMINATE BILAT MOD SED  01/03/2017  . IR ANGIO INTRA EXTRACRAN SEL INTERNAL CAROTID BILAT  MOD SED  12/21/2016  . IR ANGIO VERTEBRAL SEL VERTEBRAL BILAT MOD SED  12/21/2016  . IR ANGIO VERTEBRAL SEL VERTEBRAL BILAT MOD SED  01/03/2017  . RADIOLOGY WITH ANESTHESIA N/A 12/21/2016   Procedure: RADIOLOGY WITH ANESTHESIA;  Surgeon: Radiologist, Medication, MD;  Location: [facility] OR;  Service: Radiology;  Laterality: N/A;    FAMILY HISTORY: History reviewed. No pertinent family history.  SOCIAL HISTORY: Social History   Socioeconomic History  . Marital status: Married    Spouse name: Jamesetta So  . Number of children: 2  . Years of education: BS  . Highest education level: Not on file  Occupational History  . Occupation: D [employer]  Tobacco Use  . Smoking status: Never Smoker  . Smokeless tobacco: Never Used  Vaping Use  . Vaping Use: Never used  Substance and Sexual Activity  . Alcohol use: No  . Drug use: No  . Sexual activity: Not on file  Other Topics Concern  . Not on file  Social History Narrative   Lives with wife Jamesetta So)   Caffeine use: Dink 1 cup coffee every morning   Soda/tea- rare   Right handed    Social Determinants of Health   Financial Resource Strain: Not on file  Food Insecurity: Not on file  Transportation Needs: Not on file  Physical Activity: Not on file  Stress: Not on file  Social Connections: Not on file  Intimate Partner Violence: Not on file   PHYSICAL EXAM  Vitals:   01/07/20 0726  BP: 120/70  Pulse: 71  Weight: 219 lb (99.3 kg)  Height: 5\' 11"  (1.803 m)   Body mass index is 30.54 kg/m.  Generalized: Well developed, in no acute distress   Neurological examination  Mentation: Alert oriented to time, place, history taking. Follows all commands speech and language fluent Cranial nerve II-XII: Pupils were equal round reactive to light. Extraocular movements were full, visual field were full on confrontational test. Facial sensation and strength were normal. Head turning and shoulder shrug  were normal and symmetric. Motor: The motor testing reveals 5 over 5 strength of all 4 extremities. Good symmetric motor tone is noted throughout.  Sensory: Sensory testing is intact to soft touch on all 4 extremities. No evidence of extinction is noted.  Coordination: Cerebellar testing reveals good finger-nose-finger and heel-to-shin bilaterally.  Gait and station: Gait is normal. Tandem gait is normal. Romberg is negative. No drift is seen.  Reflexes: Deep tendon reflexes are symmetric and normal bilaterally.   DIAGNOSTIC DATA (LABS, IMAGING, TESTING) - I reviewed patient records, labs, notes, testing and imaging myself where available.  Lab Results  Component Value Date    WBC 5.7 12/20/2016   HGB 14.6 12/20/2016   HCT 42.3 12/20/2016   MCV 88.9 12/20/2016   PLT 196 12/20/2016      Component Value Date/Time   NA 137 12/20/2016 2030   K 3.4 (L) 12/20/2016 2030   CL 102 12/20/2016 2030   CO2 25 12/20/2016 2030   GLUCOSE 110 (H) 12/20/2016 2030   BUN 20 12/20/2016 2030   CREATININE 0.99 12/20/2016 2030   CALCIUM 8.9 12/20/2016 2030   GFRNONAA >60 12/20/2016 2030   GFRAA >60 12/20/2016 2030   No results found for: CHOL, HDL, LDLCALC, LDLDIRECT, TRIG, CHOLHDL No results found for: 12/22/2016 No results found for: VITAMINB12 No results found for: TSH  ASSESSMENT AND PLAN 70 y.o. year old male  has a past medical history of Kidney stone. here with:  1.  Nontraumatic subarachnoid hemorrhage, recovered  He continues to do well, his prognosis is excellent.  He may return to work in full capacity.  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  He will follow-up on an annual basis before his DOT physical.  I spent 20 minutes of face-to-face and non-face-to-face time with patient.  This included previsit chart review, lab review, study review, order entry, electronic health record documentation, patient education.  Margie Ege, AGNP-C, DNP 01/07/2020, 8:07 AM [facility] [address] [place], [place] [address] [phone]

## 2020-01-07 ENCOUNTER — Encounter: Payer: Self-pay | Admitting: Neurology

## 2020-01-07 ENCOUNTER — Ambulatory Visit: Payer: Medicare HMO | Admitting: Neurology

## 2020-01-07 ENCOUNTER — Other Ambulatory Visit: Payer: Self-pay

## 2020-01-07 VITALS — BP 120/70 | HR 71 | Ht 71.0 in | Wt 219.0 lb

## 2020-01-07 DIAGNOSIS — I609 Nontraumatic subarachnoid hemorrhage, unspecified: Secondary | ICD-10-CM

## 2020-01-07 NOTE — Patient Instructions (Signed)
We will continue seeing you once a year for your POT physical  Call if any problems See you back in 1 year

## 2020-01-07 NOTE — Progress Notes (Signed)
I have read the note, and I agree with the clinical assessment and plan.  Nao Linz K Jadden Yim   

## 2021-01-02 NOTE — Progress Notes (Signed)
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 Anne Hahn, will now be followed by Dr. 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  In 2018 he had a spontaneous nontraumatic subarachnoid hemorrhage.  He has recovered well. He will be going for his CDL license by the end of the month. Gets annual eye exam, wears glasses. No headaches. No falls, getting around well. Seeing urology for prostate issues. Here today alone. 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 Has no complaints today. He is up to date of seeing his PCP and specialists.   History 01/07/2020 SS: Trevor Perry is a 71 year old male with history of prior spontaneous nontraumatic subarachnoid hemorrhage in December 2018.  He required a ventriculostomy tube that was taken out prior to discharge.  He slowly recovered, has continued to do well.  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  He requires annual medical clearance for this.  He has done well over the last year.  He has routine follow-up with his ophthalmologist.  He wears glasses.  He has hearing aids.  He follows with urology for recurrent kidney stones.  Routine follow-up with PCP.  He has no new problems or concerns.  Presents today for evaluation unaccompanied.  REVIEW OF SYSTEMS: Out of a complete 14 system review of symptoms, the patient complains only of the following symptoms, and all other reviewed systems are negative.  N/A  ALLERGIES: Allergies  Allergen Reactions   Atorvastatin Other (See Comments)    Myalgias    Fenofibrate Other (See Comments)    Myalgias     Fenofibrate Micronized Other (See Comments)    Myalgias    HOME MEDICATIONS: Outpatient Medications Prior to  Visit  Medication Sig Dispense Refill   aspirin 81 MG tablet Take 81 mg by mouth daily.     indapamide (LOZOL) 1.25 MG tablet Take 1.25 mg by mouth daily.  2   KRILL OIL PO Take 1 capsule by mouth daily.     losartan (COZAAR) 25 MG tablet Take 25 mg by mouth daily.     magnesium 30 MG tablet Take 30 mg by mouth 2 (two) times daily.     Multiple Vitamins-Minerals (MULTIVITAMIN PO) Take 1 tablet by mouth daily.     rosuvastatin (CRESTOR) 5 MG tablet Take 1 tablet by mouth daily.     zinc gluconate 50 MG tablet Take 50 mg by mouth daily. 1/2 tab     Red Yeast Rice 600 MG TABS Take 1 tablet by mouth daily.     thiamine (VITAMIN B-1) 50 MG tablet Take 50 mg by mouth daily.     No facility-administered medications prior to visit.    PAST MEDICAL HISTORY: Past Medical History:  Diagnosis Date   Kidney stone     PAST SURGICAL HISTORY: Past Surgical History:  Procedure Laterality Date   BACK SURGERY     IR ANGIO INTRA EXTRACRAN SEL COM CAROTID INNOMINATE BILAT MOD SED  01/03/2017   IR ANGIO INTRA EXTRACRAN SEL INTERNAL CAROTID BILAT MOD SED  12/21/2016   IR ANGIO VERTEBRAL SEL VERTEBRAL BILAT MOD SED  12/21/2016   IR ANGIO VERTEBRAL SEL VERTEBRAL BILAT MOD SED  01/03/2017   RADIOLOGY WITH ANESTHESIA N/A 12/21/2016  Procedure: RADIOLOGY WITH ANESTHESIA;  Surgeon: Radiologist, Medication, MD;  Location: [facility];  Service: Radiology;  Laterality: N/A;    FAMILY HISTORY: History reviewed. No pertinent family history.  SOCIAL HISTORY: Social History   Socioeconomic History   Marital status: Married    Spouse name: Jamesetta Sohyllis   Number of children: 2   Years of education: BS   Highest education level: Not on file  Occupational History   Occupation: [employer]  Tobacco Use   Smoking status: Never   Smokeless tobacco: Never  Vaping Use   Vaping Use: Never used  Substance and Sexual Activity   Alcohol use: No   Drug use: No   Sexual activity: Not on file  Other Topics Concern   Not  on file  Social History Narrative   Lives with wife Jamesetta So(Phyllis)   Caffeine use: Dink 1 cup coffee every morning   Soda/tea- rare   Right handed    Social Determinants of Health   Financial Resource Strain: Not on file  Food Insecurity: Not on file  Transportation Needs: Not on file  Physical Activity: Not on file  Stress: Not on file  Social Connections: Not on file  Intimate Partner Violence: Not on file   PHYSICAL EXAM  Vitals:   01/03/21 0750  BP: 118/73  Pulse: 75  Weight: 205 lb 8 oz (93.2 kg)  Height: 5\' 11"  (1.803 m)    Body mass index is 28.66 kg/m.  Generalized: Well developed, in no acute distress   Neurological examination  Mentation: Alert oriented to time, place, history taking. Follows all commands speech and language fluent Cranial nerve II-XII: Pupils were equal round reactive to light. Extraocular movements were full, visual field were full on confrontational test. Facial sensation and strength were normal. Head turning and shoulder shrug were normal and symmetric. Motor: The motor testing reveals 5 over 5 strength of all 4 extremities. Good symmetric motor tone is noted throughout.  Sensory: Sensory testing is intact to soft touch on all 4 extremities. No evidence of extinction is noted.  Coordination: Cerebellar testing reveals good finger-nose-finger and heel-to-shin bilaterally.  Gait and station: Gait is normal. Tandem gait is normal. Romberg is negative. No drift is seen.  Reflexes: Deep tendon reflexes are symmetric and normal bilaterally.   DIAGNOSTIC DATA (LABS, IMAGING, TESTING) - I reviewed patient records, labs, notes, testing and imaging myself where available.  Lab Results  Component Value Date   WBC 5.7 12/20/2016   HGB 14.6 12/20/2016   HCT 42.3 12/20/2016   MCV 88.9 12/20/2016   PLT 196 12/20/2016      Component Value Date/Time   NA 137 12/20/2016 2030   K 3.4 (L) 12/20/2016 2030   CL 102 12/20/2016 2030   CO2 25 12/20/2016 2030    GLUCOSE 110 (H) 12/20/2016 2030   BUN 20 12/20/2016 2030   CREATININE 0.99 12/20/2016 2030   CALCIUM 8.9 12/20/2016 2030   GFRNONAA >60 12/20/2016 2030   GFRAA >60 12/20/2016 2030   No results found for: CHOL, HDL, LDLCALC, LDLDIRECT, TRIG, CHOLHDL No results found for: [id number] No results found for: VITAMINB12 No results found for: TSH  ASSESSMENT AND PLAN 71 y.o. year old male  has a past medical history of Kidney stone. here with:  1.  Nontraumatic subarachnoid hemorrhage, recovered  -Trevor MuttersRoy continues to do well, has no new symptoms -Letter given recommending he should be able to maintain his CDL license, note was provided today  -He will follow-up  in 1 year, as long as required by the state to maintain his CDL -He will be followed by Dr. Marjory Lies, since Dr. Anne Hahn has retired  Margie Ege, AGNP-C, DNP 01/03/2021, 8:08 AM [facility] [address] [place], [place] [address] [phone]

## 2021-01-03 ENCOUNTER — Other Ambulatory Visit: Payer: Self-pay

## 2021-01-03 ENCOUNTER — Ambulatory Visit: Payer: Medicare HMO | Admitting: Neurology

## 2021-01-03 ENCOUNTER — Encounter: Payer: Self-pay | Admitting: Neurology

## 2021-01-03 VITALS — BP 118/73 | HR 75 | Ht 71.0 in | Wt 205.5 lb

## 2021-01-03 DIAGNOSIS — I609 Nontraumatic subarachnoid hemorrhage, unspecified: Secondary | ICD-10-CM

## 2021-01-03 NOTE — Patient Instructions (Signed)
Great to see you today!  See you back in 1 year

## 2021-01-03 NOTE — Progress Notes (Signed)
I reviewed note and agree with plan.   Suanne Marker, MD 01/03/2021, 10:45 AM Certified in Neurology, Neurophysiology and Neuroimaging  [facility] [address] [place], [place] [address] [phone]

## 2021-07-01 HISTORY — PX: PROSTATECTOMY: SHX69

## 2022-01-02 NOTE — Progress Notes (Unsigned)
PATIENT: Trevor Perry DOB: 08-15-1950  REASON FOR VISIT: follow up for CDL license  HISTORY FROM: patient Primary Neurologist: Dr. Jannifer Franklin, will now be followed by Dr. Leta Baptist   HISTORY OF PRESENT ILLNESS: Today 01/03/22 Here today for follow-up. In July had prostate cancer, had prostate removed in July. Has done well, #'s look good. 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 Wears glasses. Wears hearing aid on the weekends. Rarely has headache. Getting around okay, no falls. BP 132/78 today. Sees PCP for wellness exam, labs.  On Crestor, Losartan.  Needs clearance annually for his CDL.   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  In 2018 he had a spontaneous nontraumatic subarachnoid hemorrhage.  He has recovered well. He will be going for his CDL license by the end of the month. Gets annual eye exam, wears glasses. No headaches. No falls, getting around well. Seeing urology for prostate issues. Here today alone. 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 Has no complaints today. He is up to date of seeing his PCP and specialists.   History 01/07/2020 SS: Mr. Trevor Perry is a 72 year old male with history of prior spontaneous nontraumatic subarachnoid hemorrhage in December 2018.  He required a ventriculostomy tube that was taken out prior to discharge.  He slowly recovered, has continued to do well.  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  He requires annual medical clearance for this.  He has done well over the last year.  He has routine follow-up with his ophthalmologist.  He wears glasses.  He has hearing aids.  He follows with urology for recurrent kidney stones.  Routine follow-up with PCP.  He has no new problems or concerns.  Presents today for evaluation  unaccompanied.  REVIEW OF SYSTEMS: Out of a complete 14 system review of symptoms, the patient complains only of the following symptoms, and all other reviewed systems are negative.  N/A  ALLERGIES: Allergies  Allergen Reactions   Atorvastatin Other (See Comments)    Myalgias    Fenofibrate Other (See Comments)    Myalgias     Fenofibrate Micronized Other (See Comments)    Myalgias    HOME MEDICATIONS: Outpatient Medications Prior to Visit  Medication Sig Dispense Refill   aspirin 81 MG tablet Take 81 mg by mouth daily.     indapamide (LOZOL) 1.25 MG tablet Take 1.25 mg by mouth daily.  2   KRILL OIL PO Take 1 capsule by mouth daily.     losartan (COZAAR) 25 MG tablet Take 25 mg by mouth daily.     magnesium 30 MG tablet Take 30 mg by mouth daily.     Multiple Vitamins-Minerals (MULTIVITAMIN PO) Take 1 tablet by mouth daily.     nitrofurantoin, macrocrystal-monohydrate, (MACROBID) 100 MG capsule Take 100 mg by mouth 2 (two) times daily.     rosuvastatin (CRESTOR) 5 MG tablet Take 1 tablet by mouth daily.     zinc gluconate 50 MG tablet Take 50 mg by mouth daily. 1/2 tab     No facility-administered medications prior to visit.    PAST MEDICAL HISTORY: Past Medical History:  Diagnosis Date   Kidney stone     PAST SURGICAL HISTORY: Past Surgical History:  Procedure Laterality Date   BACK SURGERY  IR ANGIO INTRA EXTRACRAN SEL COM CAROTID INNOMINATE BILAT MOD SED  01/03/2017   IR ANGIO INTRA EXTRACRAN SEL INTERNAL CAROTID BILAT MOD SED  12/21/2016   IR ANGIO VERTEBRAL SEL VERTEBRAL BILAT MOD SED  12/21/2016   IR ANGIO VERTEBRAL SEL VERTEBRAL BILAT MOD SED  01/03/2017   PROSTATECTOMY  07/2021   RADIOLOGY WITH ANESTHESIA N/A 12/21/2016   Procedure: RADIOLOGY WITH ANESTHESIA;  Surgeon: Radiologist, Medication, MD;  Location: [facility] OR;  Service: Radiology;  Laterality: N/A;    FAMILY HISTORY: History reviewed. No pertinent family history.  SOCIAL HISTORY: Social  History   Socioeconomic History   Marital status: Married    Spouse name: Jamesetta So   Number of children: 2   Years of education: BS   Highest education level: Not on file  Occupational History   Occupation: [employer]  Tobacco Use   Smoking status: Never   Smokeless tobacco: Never  Vaping Use   Vaping Use: Never used  Substance and Sexual Activity   Alcohol use: No   Drug use: No   Sexual activity: Not on file  Other Topics Concern   Not on file  Social History Narrative   Lives with wife Jamesetta So)   Caffeine use: Dink 1 cup coffee every morning   Soda/tea- rare   Right handed    Social Determinants of Health   Financial Resource Strain: Not on file  Food Insecurity: Not on file  Transportation Needs: Not on file  Physical Activity: Not on file  Stress: Not on file  Social Connections: Not on file  Intimate Partner Violence: Not on file   PHYSICAL EXAM  Vitals:   01/03/22 0747  BP: 132/78  Pulse: 65  Weight: 217 lb 8 oz (98.7 kg)  Height: 5' 11.6" (1.819 m)   Body mass index is 29.83 kg/m.  Generalized: Well developed, in no acute distress   Neurological examination  Mentation: Alert oriented to time, place, history taking. Follows all commands speech and language fluent Cranial nerve II-XII: Pupils were equal round reactive to light. Extraocular movements were full, visual field were full on confrontational test. Facial sensation and strength were normal. Head turning and shoulder shrug were normal and symmetric. Motor: The motor testing reveals 5 over 5 strength of all 4 extremities. Good symmetric motor tone is noted throughout.  Sensory: Sensory testing is intact to soft touch on all 4 extremities. No evidence of extinction is noted.  Coordination: Cerebellar testing reveals good finger-nose-finger and heel-to-shin bilaterally.  Gait and station: Gait is normal. Tandem gait is normal.   Reflexes: Deep tendon reflexes are symmetric and normal  bilaterally.   DIAGNOSTIC DATA (LABS, IMAGING, TESTING) - I reviewed patient records, labs, notes, testing and imaging myself where available.  Lab Results  Component Value Date   WBC 5.7 12/20/2016   HGB 14.6 12/20/2016   HCT 42.3 12/20/2016   MCV 88.9 12/20/2016   PLT 196 12/20/2016      Component Value Date/Time   NA 137 12/20/2016 2030   K 3.4 (L) 12/20/2016 2030   CL 102 12/20/2016 2030   CO2 25 12/20/2016 2030   GLUCOSE 110 (H) 12/20/2016 2030   BUN 20 12/20/2016 2030   CREATININE 0.99 12/20/2016 2030   CALCIUM 8.9 12/20/2016 2030   GFRNONAA >60 12/20/2016 2030   GFRAA >60 12/20/2016 2030   No results found for: "CHOL", "HDL", "LDLCALC", "LDLDIRECT", "TRIG", "CHOLHDL" No results found for: "HGBA1C" No results found for: "VITAMINB12" No results found for: "  TSH"  ASSESSMENT AND PLAN 72 y.o. year old male  has a past medical history of Kidney stone. here with:  1.  Nontraumatic subarachnoid hemorrhage, recovered  -Continues to do very well, has no new issues or concerns -I gave him a letter for his CDL license -He will follow-up in 1 year as required by the state to maintain his CDL license -He should continue close follow-up with his PCP for management of vascular risk factors  Butler Denmark, AGNP-C, DNP 01/03/2022, 7:50 AM [facility] [address], [place], [place] [address] [phone]

## 2022-01-03 ENCOUNTER — Ambulatory Visit: Payer: Medicare HMO | Admitting: Neurology

## 2022-01-03 ENCOUNTER — Encounter: Payer: Self-pay | Admitting: Neurology

## 2022-01-03 VITALS — BP 132/78 | HR 65 | Ht 71.6 in | Wt 217.5 lb

## 2022-01-03 DIAGNOSIS — I609 Nontraumatic subarachnoid hemorrhage, unspecified: Secondary | ICD-10-CM | POA: Diagnosis not present

## 2022-11-27 ENCOUNTER — Telehealth: Payer: Self-pay | Admitting: Neurology

## 2022-11-27 NOTE — Telephone Encounter (Signed)
Called to reschedule 01/03/23 appt due to Maralyn Sago being out. 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 He said that it's due for renewal in January. I went ahead and scheduled pt for first available which is 04/02/23 and added appt to the wait list

## 2022-11-27 NOTE — Telephone Encounter (Signed)
Pt rescheduled for 12/11/22 at 7:30am

## 2022-11-27 NOTE — Telephone Encounter (Signed)
Call to patient to offer visit on 12/10 or 12/12 at 730 am with sarah, in office. No answer. Left message to return call.

## 2022-12-10 NOTE — Progress Notes (Unsigned)
PATIENT: Trevor Perry DOB: 23-Dec-1950  REASON FOR VISIT: follow up for CDL license  HISTORY FROM: patient Primary Neurologist: Dr. Anne Hahn, will now be followed by Dr. Marjory Lies   HISTORY OF PRESENT ILLNESS: Today 12/11/22 Doing great. Needs to be seen annual to maintain his CDL. 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 Health has been good. 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 12/07/22, LDL 104, LDL felt to be a little high due to poor eating lately with holiday.  A1C 5.9. needs to lose 10 weights. No headaches, wears glasses. Has hearing aids. Balance is good, no falls. Today BP 129/62. On Losartan and Crestor.  01/03/22 SS: Here today for follow-up. In July had prostate cancer, had prostate removed in July. Has done well, #'s look good. 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 Wears glasses. Wears hearing aid on the weekends. Rarely has headache. Getting around okay, no falls. BP 132/78 today. Sees PCP for wellness exam, labs.  On Crestor, Losartan.  Needs clearance annually for his CDL.   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  In 2018 he had a spontaneous nontraumatic subarachnoid hemorrhage.  He has recovered well. He will be going for his CDL license by the end of the month. Gets annual eye exam, wears glasses. No headaches. No falls, getting around well. Seeing urology for prostate issues. Here today alone. 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 Has no complaints today. He is up to date of seeing his PCP and specialists.   History 01/07/2020 SS: Trevor Perry is a 72 year old male with history of prior spontaneous nontraumatic subarachnoid hemorrhage in December 2018.  He required a ventriculostomy tube that  was taken out prior to discharge.  He slowly recovered, has continued to do well.  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  He requires annual medical clearance for this.  He has done well over the last year.  He has routine follow-up with his ophthalmologist.  He wears glasses.  He has hearing aids.  He follows with urology for recurrent kidney stones.  Routine follow-up with PCP.  He has no new problems or concerns.  Presents today for evaluation unaccompanied.  REVIEW OF SYSTEMS: Out of a complete 14 system review of symptoms, the patient complains only of the following symptoms, and all other reviewed systems are negative.  N/A  ALLERGIES: Allergies  Allergen Reactions   Atorvastatin Other (See Comments)    Myalgias    Fenofibrate Other (See Comments)    Myalgias     Fenofibrate Micronized Other (See Comments)    Myalgias    HOME MEDICATIONS: Outpatient Medications Prior to Visit  Medication Sig Dispense Refill   aspirin 81 MG tablet Take 81 mg by mouth daily.     indapamide (LOZOL) 1.25 MG tablet Take 1.25 mg by mouth daily.  2   KRILL OIL PO Take 1 capsule by mouth daily.     losartan (COZAAR) 25 MG tablet Take 25 mg by mouth daily.     magnesium 30 MG tablet Take 30 mg by mouth daily.     Multiple Vitamins-Minerals (MULTIVITAMIN PO) Take 1 tablet by  mouth daily.     rosuvastatin (CRESTOR) 5 MG tablet Take 1 tablet by mouth daily.     zinc gluconate 50 MG tablet Take 50 mg by mouth daily. 1/2 tab     nitrofurantoin, macrocrystal-monohydrate, (MACROBID) 100 MG capsule Take 100 mg by mouth 2 (two) times daily.     No facility-administered medications prior to visit.    PAST MEDICAL HISTORY: Past Medical History:  Diagnosis Date   Kidney stone     PAST SURGICAL HISTORY: Past Surgical History:  Procedure Laterality Date   BACK SURGERY     IR ANGIO INTRA EXTRACRAN SEL COM CAROTID INNOMINATE  BILAT MOD SED  01/03/2017   IR ANGIO INTRA EXTRACRAN SEL INTERNAL CAROTID BILAT MOD SED  12/21/2016   IR ANGIO VERTEBRAL SEL VERTEBRAL BILAT MOD SED  12/21/2016   IR ANGIO VERTEBRAL SEL VERTEBRAL BILAT MOD SED  01/03/2017   PROSTATECTOMY  07/2021   RADIOLOGY WITH ANESTHESIA N/A 12/21/2016   Procedure: RADIOLOGY WITH ANESTHESIA;  Surgeon: Radiologist, Medication, MD;  Location: [facility] OR;  Service: Radiology;  Laterality: N/A;    FAMILY HISTORY: History reviewed. No pertinent family history.  SOCIAL HISTORY: Social History   Socioeconomic History   Marital status: Married    Spouse name: Trevor Perry   Number of children: 2   Years of education: BS   Highest education level: Not on file  Occupational History   Occupation: [employer]  Tobacco Use   Smoking status: Never   Smokeless tobacco: Never  Vaping Use   Vaping status: Never Used  Substance and Sexual Activity   Alcohol use: No   Drug use: No   Sexual activity: Not on file  Other Topics Concern   Not on file  Social History Narrative   Lives with wife Trevor Perry)   Caffeine use: Dink 1 cup coffee every morning   Soda/tea- rare   Right handed    Social Determinants of Health   Financial Resource Strain: Not on file  Food Insecurity: Not on file  Transportation Needs: Not on file  Physical Activity: Not on file  Stress: Not on file  Social Connections: Not on file  Intimate Partner Violence: Not on file   PHYSICAL EXAM  Vitals:   12/11/22 0727  BP: 129/62  Pulse: 69  Weight: 220 lb 8 oz (100 kg)  Height: 5\' 11"  (1.803 m)    Body mass index is 30.75 kg/m.  Generalized: Well developed, in no acute distress   Neurological examination  Mentation: Alert oriented to time, place, history taking. Follows all commands speech and language fluent Cranial nerve II-XII: Pupils were equal round reactive to light. Extraocular movements were full, visual field were full on confrontational test. Facial sensation and  strength were normal. Head turning and shoulder shrug were normal and symmetric. Motor: The motor testing reveals 5 over 5 strength of all 4 extremities. Good symmetric motor tone is noted throughout.  Sensory: Sensory testing is intact to soft touch on all 4 extremities. No evidence of extinction is noted.  Coordination: Cerebellar testing reveals good finger-nose-finger and heel-to-shin bilaterally.  Gait and station: Gait is normal. Tandem gait is normal.   Reflexes: Deep tendon reflexes are symmetric and normal bilaterally.   DIAGNOSTIC DATA (LABS, IMAGING, TESTING) - I reviewed patient records, labs, notes, testing and imaging myself where available.  Lab Results  Component Value Date   WBC 5.7 12/20/2016   HGB 14.6 12/20/2016   HCT 42.3 12/20/2016   MCV 88.9  12/20/2016   PLT 196 12/20/2016      Component Value Date/Time   NA 137 12/20/2016 2030   K 3.4 (L) 12/20/2016 2030   CL 102 12/20/2016 2030   CO2 25 12/20/2016 2030   GLUCOSE 110 (H) 12/20/2016 2030   BUN 20 12/20/2016 2030   CREATININE 0.99 12/20/2016 2030   CALCIUM 8.9 12/20/2016 2030   GFRNONAA >60 12/20/2016 2030   GFRAA >60 12/20/2016 2030   No results found for: "CHOL", "HDL", "LDLCALC", "LDLDIRECT", "TRIG", "CHOLHDL" No results found for: "HGBA1C" No results found for: "VITAMINB12" No results found for: "TSH"  ASSESSMENT AND PLAN 72 y.o. year old male  has a past medical history of Kidney stone. here with:  1.  Nontraumatic subarachnoid hemorrhage, recovered  -Trevor Perry continues to do very well.  He has no residual deficits.  He is independent and is very active. -I updated a letter to maintain his CDL license -He is required to return here by the state to maintain his CDL license annually -He should continue close follow-up with his PCP for management of vascular risk factors -I will see him back in 1 year.  Margie Ege, AGNP-C, DNP 12/11/2022, 7:29 AM [facility] [address], [address] [place], [place] [address] [phone]

## 2022-12-11 ENCOUNTER — Ambulatory Visit: Payer: Medicare HMO | Admitting: Neurology

## 2022-12-11 ENCOUNTER — Encounter: Payer: Self-pay | Admitting: Neurology

## 2022-12-11 VITALS — BP 129/62 | HR 69 | Ht 71.0 in | Wt 220.5 lb

## 2022-12-11 DIAGNOSIS — I609 Nontraumatic subarachnoid hemorrhage, unspecified: Secondary | ICD-10-CM

## 2022-12-11 NOTE — Patient Instructions (Signed)
Nice to see you.  I have provided you a letter to maintain your CDL license.  Please continue follow-up with your primary care doctor for management of vascular risk factors.  I will see you back in 1 year.  As required by the state.  Thanks!!

## 2023-01-03 ENCOUNTER — Ambulatory Visit: Payer: Medicare HMO | Admitting: Neurology

## 2023-04-02 ENCOUNTER — Ambulatory Visit: Payer: Medicare HMO | Admitting: Neurology

## 2023-12-10 NOTE — Progress Notes (Unsigned)
 PATIENT: Trevor Perry DOB: 01-Nov-1950  REASON FOR VISIT: follow up for CDL license  HISTORY FROM: patient Primary Neurologist: Dr. Jenel, will now be followed by Dr. Margaret   HISTORY OF PRESENT ILLNESS: Today 12/10/23   12/11/22 SS: Doing great. 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 Health has been good. 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 12/07/22, LDL 104, LDL felt to be a little high due to poor eating lately with holiday.  A1C 5.9. needs to lose 10 weights. No headaches, wears glasses. Has hearing aids. Balance is good, no falls. Today BP 129/62. On Losartan and Crestor.  01/03/22 SS: Here today for follow-up. In July had prostate cancer, had prostate removed in July. Has done well, #'s look good. 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 Works full time. Wears glasses. Wears hearing aid on the weekends. Rarely has headache. Getting around okay, no falls. BP 132/78 today. Sees PCP for wellness exam, labs.  On Crestor, Losartan.  Needs clearance annually for his CDL.   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  In 2018 he had a spontaneous nontraumatic subarachnoid hemorrhage.  He has recovered well. He will be going for his CDL license by the end of the month. Gets annual eye exam, wears glasses. No headaches. No falls, getting around well. Seeing urology for prostate issues. Here today alone. 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 Has no complaints today. He is up to date of seeing his PCP and specialists.   History 01/07/2020 SS: Mr. 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  He required a  ventriculostomy tube that was taken out prior to discharge.  He slowly recovered, has continued to do well.  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  He has a agricultural consultant to operate heavy equipment.  He requires annual medical clearance for this.  He has done well over the last year.  He has routine follow-up with his ophthalmologist.  He wears glasses.  He has hearing aids.  He follows with urology for recurrent kidney stones.  Routine follow-up with PCP.  He has no new problems or concerns.  Presents today for evaluation unaccompanied.  REVIEW OF SYSTEMS: Out of a complete 14 system review of symptoms, the patient complains only of the following symptoms, and all other reviewed systems are negative.  N/A  ALLERGIES: Allergies  Allergen Reactions   Atorvastatin Other (See Comments)    Myalgias    Fenofibrate Other (See Comments)    Myalgias     Fenofibrate Micronized Other (See Comments)    Myalgias    HOME MEDICATIONS: Outpatient Medications Prior to Visit  Medication Sig Dispense Refill   aspirin 81 MG tablet Take 81 mg by mouth daily.     indapamide  (LOZOL ) 1.25 MG tablet Take 1.25 mg by mouth daily.  2   KRILL OIL PO Take 1 capsule by mouth daily.     losartan (COZAAR) 25 MG tablet Take 25 mg by mouth daily.     magnesium 30 MG tablet Take 30 mg by mouth daily.     Multiple Vitamins-Minerals (MULTIVITAMIN PO)  Take 1 tablet by mouth daily.     rosuvastatin (CRESTOR) 5 MG tablet Take 1 tablet by mouth daily.     zinc gluconate 50 MG tablet Take 50 mg by mouth daily. 1/2 tab     No facility-administered medications prior to visit.    PAST MEDICAL HISTORY: Past Medical History:  Diagnosis Date   Kidney stone     PAST SURGICAL HISTORY: Past Surgical History:  Procedure Laterality Date   BACK SURGERY     IR ANGIO INTRA EXTRACRAN SEL COM CAROTID INNOMINATE BILAT MOD SED  01/03/2017   IR ANGIO INTRA EXTRACRAN SEL INTERNAL CAROTID BILAT MOD SED   12/21/2016   IR ANGIO VERTEBRAL SEL VERTEBRAL BILAT MOD SED  12/21/2016   IR ANGIO VERTEBRAL SEL VERTEBRAL BILAT MOD SED  01/03/2017   PROSTATECTOMY  07/2021   RADIOLOGY WITH ANESTHESIA N/A 12/21/2016   Procedure: RADIOLOGY WITH ANESTHESIA;  Surgeon: Radiologist, Medication, MD;  Location: [facility] OR;  Service: Radiology;  Laterality: N/A;    FAMILY HISTORY: No family history on file.  SOCIAL HISTORY: Social History   Socioeconomic History   Marital status: Married    Spouse name: Tilton   Number of children: 2   Years of education: BS   Highest education level: Not on file  Occupational History   Occupation: [employer]  Tobacco Use   Smoking status: Never   Smokeless tobacco: Never  Vaping Use   Vaping status: Never Used  Substance and Sexual Activity   Alcohol use: No   Drug use: No   Sexual activity: Not on file  Other Topics Concern   Not on file  Social History Narrative   Lives with wife Quillian)   Caffeine use: Dink 1 cup coffee every morning   Soda/tea- rare   Right handed    Social Drivers of Corporate Investment Banker Strain: Not on file  Food Insecurity: Not on file  Transportation Needs: Not on file  Physical Activity: Not on file  Stress: Not on file  Social Connections: Not on file  Intimate Partner Violence: Not on file   PHYSICAL EXAM  There were no vitals filed for this visit.   There is no height or weight on file to calculate BMI.  Generalized: Well developed, in no acute distress   Neurological examination  Mentation: Alert oriented to time, place, history taking. Follows all commands speech and language fluent Cranial nerve II-XII: Pupils were equal round reactive to light. Extraocular movements were full, visual field were full on confrontational test. Facial sensation and strength were normal. Head turning and shoulder shrug were normal and symmetric. Motor: The motor testing reveals 5 over 5 strength of all 4 extremities. Good  symmetric motor tone is noted throughout.  Sensory: Sensory testing is intact to soft touch on all 4 extremities. No evidence of extinction is noted.  Coordination: Cerebellar testing reveals good finger-nose-finger and heel-to-shin bilaterally.  Gait and station: Gait is normal. Tandem gait is normal.   Reflexes: Deep tendon reflexes are symmetric and normal bilaterally.   DIAGNOSTIC DATA (LABS, IMAGING, TESTING) - I reviewed patient records, labs, notes, testing and imaging myself where available.  Lab Results  Component Value Date   WBC 5.7 12/20/2016   HGB 14.6 12/20/2016   HCT 42.3 12/20/2016   MCV 88.9 12/20/2016   PLT 196 12/20/2016      Component Value Date/Time   NA 137 12/20/2016 2030   K 3.4 (L) 12/20/2016 2030  CL 102 12/20/2016 2030   CO2 25 12/20/2016 2030   GLUCOSE 110 (H) 12/20/2016 2030   BUN 20 12/20/2016 2030   CREATININE 0.99 12/20/2016 2030   CALCIUM 8.9 12/20/2016 2030   GFRNONAA >60 12/20/2016 2030   GFRAA >60 12/20/2016 2030   No results found for: CHOL, HDL, LDLCALC, LDLDIRECT, TRIG, CHOLHDL No results found for: YHAJ8R No results found for: VITAMINB12 No results found for: TSH  ASSESSMENT AND PLAN 73 y.o. year old male  has a past medical history of Kidney stone. here with:  1.  Nontraumatic subarachnoid hemorrhage, recovered  -Yasir continues to do very well.  He has no residual deficits.  He is independent and is very active. -I updated a letter to maintain his CDL license -He is required to return here by the state to maintain his CDL license annually -He should continue close follow-up with his PCP for management of vascular risk factors -I will see him back in 1 year.  Lauraine Born, AGNP-C, DNP 12/10/2023, 2:17 PM [facility] [address] [place], [place] [address] [phone]

## 2023-12-11 ENCOUNTER — Ambulatory Visit: Payer: Medicare HMO | Admitting: Neurology

## 2023-12-11 ENCOUNTER — Encounter: Payer: Self-pay | Admitting: Neurology

## 2023-12-11 VITALS — BP 130/70 | HR 71 | Resp 15 | Ht 71.5 in

## 2023-12-11 DIAGNOSIS — I609 Nontraumatic subarachnoid hemorrhage, unspecified: Secondary | ICD-10-CM | POA: Diagnosis not present

## 2023-12-11 NOTE — Patient Instructions (Signed)
 Great to see you! Call for any issues Continue follow-up with primary care, good management of vascular risk factors Follow-up in 1 year.  Thanks!!

## 2024-12-10 ENCOUNTER — Ambulatory Visit: Admitting: Neurology
